# Patient Record
Sex: Female | Born: 1985
Health system: Southern US, Community
[De-identification: ages and names within clinical notes are randomized; demographics above are authoritative.]

## PROBLEM LIST (undated history)

## (undated) DIAGNOSIS — N289 Disorder of kidney and ureter, unspecified: Secondary | ICD-10-CM

## (undated) DIAGNOSIS — O1490 Unspecified pre-eclampsia, unspecified trimester: Secondary | ICD-10-CM

## (undated) DIAGNOSIS — N926 Irregular menstruation, unspecified: Secondary | ICD-10-CM

## (undated) DIAGNOSIS — R002 Palpitations: Secondary | ICD-10-CM

## (undated) DIAGNOSIS — E282 Polycystic ovarian syndrome: Secondary | ICD-10-CM

## (undated) HISTORY — PX: ENDOMETRIAL ABLATION: SHX621

## (undated) HISTORY — DX: Polycystic ovarian syndrome: E28.2

## (undated) HISTORY — PX: SOFT TISSUE CYST EXCISION: SHX2418

## (undated) HISTORY — PX: WISDOM TOOTH EXTRACTION: SHX21

## (undated) HISTORY — PX: TUBAL LIGATION: SHX77

---
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
## 2003-01-22 ENCOUNTER — Emergency Department (HOSPITAL_COMMUNITY): Admission: EM | Admit: 2003-01-22 | Discharge: 2003-01-22 | Payer: Self-pay | Admitting: Emergency Medicine

## 2003-01-27 ENCOUNTER — Emergency Department (HOSPITAL_COMMUNITY): Admission: EM | Admit: 2003-01-27 | Discharge: 2003-01-27 | Payer: Self-pay | Admitting: Emergency Medicine

## 2003-07-31 ENCOUNTER — Encounter (INDEPENDENT_AMBULATORY_CARE_PROVIDER_SITE_OTHER): Payer: Self-pay | Admitting: *Deleted

## 2003-07-31 ENCOUNTER — Inpatient Hospital Stay (HOSPITAL_COMMUNITY): Admission: AD | Admit: 2003-07-31 | Discharge: 2003-08-01 | Payer: Self-pay | Admitting: Obstetrics & Gynecology

## 2009-01-21 ENCOUNTER — Ambulatory Visit: Payer: Self-pay | Admitting: Radiology

## 2009-01-21 ENCOUNTER — Emergency Department (HOSPITAL_BASED_OUTPATIENT_CLINIC_OR_DEPARTMENT_OTHER): Admission: EM | Admit: 2009-01-21 | Discharge: 2009-01-21 | Payer: Self-pay | Admitting: Emergency Medicine

## 2009-05-17 ENCOUNTER — Emergency Department (HOSPITAL_BASED_OUTPATIENT_CLINIC_OR_DEPARTMENT_OTHER): Admission: EM | Admit: 2009-05-17 | Discharge: 2009-05-17 | Payer: Self-pay | Admitting: Emergency Medicine

## 2010-03-19 ENCOUNTER — Emergency Department (HOSPITAL_BASED_OUTPATIENT_CLINIC_OR_DEPARTMENT_OTHER): Admission: EM | Admit: 2010-03-19 | Discharge: 2010-03-19 | Payer: Self-pay | Admitting: Emergency Medicine

## 2010-03-19 ENCOUNTER — Ambulatory Visit: Payer: Self-pay | Admitting: Diagnostic Radiology

## 2010-05-26 LAB — BASIC METABOLIC PANEL
BUN: 9 mg/dL (ref 6–23)
CO2: 28 mEq/L (ref 19–32)
Calcium: 8.9 mg/dL (ref 8.4–10.5)
Chloride: 106 mEq/L (ref 96–112)
Creatinine, Ser: 0.7 mg/dL (ref 0.4–1.2)
GFR calc Af Amer: >60 mL/min (ref >60)
GFR calc non Af Amer: >60 mL/min (ref >60)
Glucose, Bld: 87 mg/dL (ref 70–99)
Potassium: 3.5 mEq/L (ref 3.5–5.1)
Sodium: 138 mEq/L (ref 135–145)

## 2010-05-26 LAB — CBC
HCT: 35.4 % — ABNORMAL LOW (ref 36.0–46.0)
Hemoglobin: 11.3 g/dL — ABNORMAL LOW (ref 12.0–15.0)
MCH: 26.8 pg (ref 26.0–34.0)
MCHC: 31.9 g/dL (ref 30.0–36.0)
MCV: 84.1 fL (ref 78.0–100.0)
Platelets: 199 10*3/uL (ref 150–400)
RBC: 4.21 MIL/uL (ref 3.87–5.11)
RDW: 14.3 % (ref 11.5–15.5)
WBC: 3 10*3/uL — ABNORMAL LOW (ref 4.0–10.5)

## 2010-06-01 ENCOUNTER — Ambulatory Visit (HOSPITAL_COMMUNITY)
Admission: RE | Admit: 2010-06-01 | Discharge: 2010-06-01 | Payer: Self-pay | Source: Home / Self Care | Attending: Obstetrics and Gynecology | Admitting: Obstetrics and Gynecology

## 2010-06-05 LAB — HCG, SERUM, QUALITATIVE: Preg, Serum: NEGATIVE

## 2010-06-05 LAB — TYPE AND SCREEN
ABO/RH(D): O POS
Antibody Screen: NEGATIVE

## 2010-06-05 LAB — ABO/RH: ABO/RH(D): O POS

## 2011-09-10 ENCOUNTER — Emergency Department (HOSPITAL_BASED_OUTPATIENT_CLINIC_OR_DEPARTMENT_OTHER)
Admission: EM | Admit: 2011-09-10 | Discharge: 2011-09-10 | Disposition: A | Discharge status: Home / Self Care | Payer: Self-pay | Attending: Emergency Medicine | Admitting: Emergency Medicine

## 2011-09-10 ENCOUNTER — Encounter (HOSPITAL_BASED_OUTPATIENT_CLINIC_OR_DEPARTMENT_OTHER): Payer: Self-pay

## 2011-09-10 ENCOUNTER — Emergency Department (INDEPENDENT_AMBULATORY_CARE_PROVIDER_SITE_OTHER): Payer: Self-pay

## 2011-09-10 DIAGNOSIS — K5289 Other specified noninfective gastroenteritis and colitis: Secondary | ICD-10-CM | POA: Insufficient documentation

## 2011-09-10 DIAGNOSIS — F17200 Nicotine dependence, unspecified, uncomplicated: Secondary | ICD-10-CM | POA: Insufficient documentation

## 2011-09-10 DIAGNOSIS — R112 Nausea with vomiting, unspecified: Secondary | ICD-10-CM

## 2011-09-10 DIAGNOSIS — R1011 Right upper quadrant pain: Secondary | ICD-10-CM

## 2011-09-10 DIAGNOSIS — K529 Noninfective gastroenteritis and colitis, unspecified: Secondary | ICD-10-CM

## 2011-09-10 DIAGNOSIS — R109 Unspecified abdominal pain: Secondary | ICD-10-CM | POA: Insufficient documentation

## 2011-09-10 HISTORY — DX: Unspecified pre-eclampsia, unspecified trimester: O14.90

## 2011-09-10 HISTORY — DX: Palpitations: R00.2

## 2011-09-10 LAB — DIFFERENTIAL
Basophils Absolute: 0 10*3/uL (ref 0.0–0.1)
Basophils Relative: 0 % (ref 0–1)
Eosinophils Absolute: 0.1 10*3/uL (ref 0.0–0.7)
Eosinophils Relative: 2 % (ref 0–5)
Lymphocytes Relative: 45 % (ref 12–46)
Lymphs Abs: 1.5 10*3/uL (ref 0.7–4.0)
Monocytes Absolute: 0.4 10*3/uL (ref 0.1–1.0)
Monocytes Relative: 11 % (ref 3–12)
Neutro Abs: 1.4 10*3/uL — ABNORMAL LOW (ref 1.7–7.7)
Neutrophils Relative %: 42 % — ABNORMAL LOW (ref 43–77)

## 2011-09-10 LAB — URINALYSIS, ROUTINE W REFLEX MICROSCOPIC
Bilirubin Urine: NEGATIVE
Glucose, UA: NEGATIVE mg/dL
Hgb urine dipstick: NEGATIVE
Ketones, ur: NEGATIVE mg/dL
Leukocytes, UA: NEGATIVE
Nitrite: NEGATIVE
Protein, ur: NEGATIVE mg/dL
Specific Gravity, Urine: 1.037 — ABNORMAL HIGH (ref 1.005–1.030)
Urobilinogen, UA: 1 mg/dL (ref 0.0–1.0)
pH: 6 (ref 5.0–8.0)

## 2011-09-10 LAB — CBC
HCT: 34.3 % — ABNORMAL LOW (ref 36.0–46.0)
Hemoglobin: 11.5 g/dL — ABNORMAL LOW (ref 12.0–15.0)
MCH: 28 pg (ref 26.0–34.0)
MCHC: 33.5 g/dL (ref 30.0–36.0)
MCV: 83.5 fL (ref 78.0–100.0)
Platelets: 131 10*3/uL — ABNORMAL LOW (ref 150–400)
RBC: 4.11 MIL/uL (ref 3.87–5.11)
RDW: 13.4 % (ref 11.5–15.5)
WBC: 3.3 10*3/uL — ABNORMAL LOW (ref 4.0–10.5)

## 2011-09-10 LAB — COMPREHENSIVE METABOLIC PANEL
ALT: 11 U/L (ref 0–35)
AST: 15 U/L (ref 0–37)
Albumin: 3.7 g/dL (ref 3.5–5.2)
Alkaline Phosphatase: 30 U/L — ABNORMAL LOW (ref 39–117)
BUN: 10 mg/dL (ref 6–23)
CO2: 24 mEq/L (ref 19–32)
Calcium: 9 mg/dL (ref 8.4–10.5)
Chloride: 107 mEq/L (ref 96–112)
Creatinine, Ser: 0.6 mg/dL (ref 0.50–1.10)
GFR calc Af Amer: >90 mL/min (ref >90)
GFR calc non Af Amer: >90 mL/min (ref >90)
Glucose, Bld: 86 mg/dL (ref 70–99)
Potassium: 3.5 mEq/L (ref 3.5–5.1)
Sodium: 140 mEq/L (ref 135–145)
Total Bilirubin: 0.4 mg/dL (ref 0.3–1.2)
Total Protein: 6.3 g/dL (ref 6.0–8.3)

## 2011-09-10 LAB — LIPASE, BLOOD: Lipase: 25 U/L (ref 11–59)

## 2011-09-10 LAB — PREGNANCY, URINE: Preg Test, Ur: NEGATIVE

## 2011-09-10 MED ORDER — GI COCKTAIL ~~LOC~~
30.0000 mL | Freq: Once | ORAL | Status: AC
  Administered 2011-09-10: 30 mL via ORAL
  Filled 2011-09-10: qty 30

## 2011-09-10 MED ORDER — SODIUM CHLORIDE 0.9 % IV BOLUS (SEPSIS)
1000.0000 mL | Freq: Once | INTRAVENOUS | Status: AC
  Administered 2011-09-10: 1000 mL via INTRAVENOUS

## 2011-09-10 MED ORDER — FENTANYL CITRATE 0.05 MG/ML IJ SOLN
100.0000 ug | Freq: Once | INTRAMUSCULAR | Status: AC
  Administered 2011-09-10: 100 ug via INTRAVENOUS
  Filled 2011-09-10: qty 2

## 2011-09-10 MED ORDER — HYDROCODONE-ACETAMINOPHEN 5-325 MG PO TABS: 1.0000 | ORAL_TABLET | Freq: Four times a day (QID) | ORAL | Status: AC | PRN

## 2011-09-10 MED ORDER — ONDANSETRON HCL 4 MG/2ML IJ SOLN
4.0000 mg | Freq: Once | INTRAMUSCULAR | Status: AC
  Administered 2011-09-10: 4 mg via INTRAVENOUS
  Filled 2011-09-10: qty 2

## 2011-09-10 MED ORDER — PROMETHAZINE HCL 25 MG PO TABS: 25.0000 mg | ORAL_TABLET | Freq: Four times a day (QID) | ORAL | Status: DC | PRN

## 2011-09-10 MED ORDER — KETOROLAC TROMETHAMINE 30 MG/ML IJ SOLN
30.0000 mg | Freq: Once | INTRAMUSCULAR | Status: AC
  Administered 2011-09-10: 30 mg via INTRAVENOUS
  Filled 2011-09-10: qty 1

## 2011-09-10 MED ORDER — PANTOPRAZOLE SODIUM 40 MG PO TBEC
40.0000 mg | DELAYED_RELEASE_TABLET | Freq: Once | ORAL | Status: AC
  Administered 2011-09-10: 40 mg via ORAL
  Filled 2011-09-10: qty 1

## 2011-09-10 MED ORDER — HYDROCODONE-ACETAMINOPHEN 5-325 MG PO TABS: 1.0000 | ORAL_TABLET | Freq: Four times a day (QID) | ORAL | Status: DC | PRN

## 2011-09-10 NOTE — Discharge Instructions (Signed)
Return here as needed. Slowly increase your fluids. You will want to eat a bland diet over the next 24 hours.

## 2011-09-10 NOTE — ED Provider Notes (Signed)
History     CSN: [id number]  Arrival date & time 09/10/11  1136   First MD Initiated Contact with Patient 09/10/11 1157      Chief Complaint  Patient presents with  . Abdominal Pain    (Consider location/radiation/quality/duration/timing/severity/associated sxs/prior treatment) HPI The patient presents to the ER with a 2 day history of abdominal pain after eating at a chinese buffet. The patient states that she had a BM with no relief of her pain. The patient sates that she has had several episodes of vomiting. The patient states that nothing seems to make the pain better. The pain is increased with palpation of her abdomen. The patient states that the pain is significant at times and seems to reduce at others. The patient has not tried any medications for her discomfort. She states that eating seemed to increase her pain. Patient denies chest pain, SOB, weakness, vomiting, dysuria, vaginal discharge, vaginal bleeding, fever, back pain , or headache.  Past Medical History  Diagnosis Date  . Heart palpitations   . Preeclampsia     Past Surgical History  Procedure Date  . Cesarean section   . Endometrial ablation   . Tubal ligation   . Soft tissue cyst excision   . Wisdom tooth extraction     History reviewed. No pertinent family history.  History  Substance Use Topics  . Smoking status: Current Some Day Smoker  . Smokeless tobacco: Not on file  . Alcohol Use: Yes     socially    OB History    Grav Para Term Preterm Abortions TAB SAB Ect Mult Living                  Review of Systems All others negative as noted in the HPI Allergies  Review of patient's allergies indicates no known allergies.  Home Medications   Current Outpatient Rx  Name Route Sig Dispense Refill  . CYCLOBENZAPRINE HCL 5 MG PO TABS Oral Take 5 mg by mouth at bedtime as needed.    Marland Kitchen METOPROLOL SUCCINATE ER 25 MG PO TB24 Oral Take 25 mg by mouth daily.      BP 102/68  Pulse 74  Temp(Src)  98.4 F (36.9 C) (Oral)  Resp 17  Ht 5\' 1"  (1.549 m)  Wt 140 lb (63.504 kg)  BMI 26.45 kg/m2  SpO2 100%  LMP 08/20/2011  Physical Exam Physical Examination: General appearance - alert, well appearing, and in no distress, oriented to person, place, and time and normal appearing weight Mental status - alert, oriented to person, place, and time, normal mood, behavior, speech, dress, motor activity, and thought processes Ears - bilateral TM's and external ear canals normal Nose - normal and patent, no erythema, discharge or polyps Mouth - mucous membranes moist, pharynx normal without lesions Lymphatics - no palpable lymphadenopathy, no hepatosplenomegaly Chest - clear to auscultation, no wheezes, rales or rhonchi, symmetric air entry, no tachypnea, retractions or cyanosis Heart - normal rate, regular rhythm, normal S1, S2, no murmurs, rubs, clicks or gallops Abdomen - tenderness noted  In the upper R and epigastric region no rebound tenderness noted No guarding noted bowel sounds normal hepatomegaly none Splenomegaly none no bladder distension noted Skin - normal coloration and turgor, no rashes, no suspicious skin lesions noted  ED Course  Procedures (including critical care time)  Labs Reviewed  URINALYSIS, ROUTINE W REFLEX MICROSCOPIC - Abnormal; Notable for the following:    Color, Urine AMBER (*) BIOCHEMICALS MAY BE AFFECTED BY  COLOR   APPearance CLOUDY (*)    Specific Gravity, Urine 1.037 (*)    All other components within normal limits  CBC - Abnormal; Notable for the following:    WBC 3.3 (*)    Hemoglobin 11.5 (*)    HCT 34.3 (*)    Platelets 131 (*)    All other components within normal limits  DIFFERENTIAL - Abnormal; Notable for the following:    Neutrophils Relative 42 (*)    Neutro Abs 1.4 (*)    All other components within normal limits  COMPREHENSIVE METABOLIC PANEL - Abnormal; Notable for the following:    Alkaline Phosphatase 30 (*)    All other  components within normal limits  PREGNANCY, URINE  LIPASE, BLOOD   US Abdomen Complete  09/10/2011  *RADIOLOGY REPORT*  Clinical Data:  Right upper quadrant pain.  Nausea and vomiting.  COMPLETE ABDOMINAL ULTRASOUND  Comparison:  None.  Findings:  Gallbladder:  No gallstones, gallbladder wall thickening, or pericholecystic fluid. Negative sonographic Murphy's sign.  Common bile duct:  Normal.  1 mm in diameter.  Liver:  Normal.  IVC:  Partially obscured by bowel gas.  The visualized portion is normal.  Pancreas:  Normal.  Spleen:  Normal.  5.3 cm in length.  Right Kidney:  Normal.  10.7 cm in length. Tiny portion of the lower pole is obscured by bowel gas.  Left Kidney:  Normal.  10.3 cm in length.  Abdominal aorta:  Partially obscured by bowel gas. 2 cm maximal diameter.  IMPRESSION: Normal exam.  Original Report Authenticated By: Gwynn Burly, M.D.    Patient re-examined and her pain is decreased significantly on palpation  Patient has been rechecked x2 and she is feeling some better. The patient most likely has a gastroenteritis based on her HPI and PE. The patient is able to tolerate oral fluids. Told to return here as needed.  MDM  MDM Reviewed: nursing note and vitals Interpretation: labs and ultrasound   The patient is explained that this could be an evolving process that has yet to declare itself and that she will need to return here as needed for any worsening or changes in her condition. This is most likely a gastroenteritis versus an early biliary issue.         Carlyle Dolly, PA-C 09/13/11 [phone]

## 2011-09-10 NOTE — ED Notes (Signed)
Advised by lab blood hemolyzed-redraw light green and purple top as advised

## 2011-09-10 NOTE — ED Notes (Signed)
Pt is waiting in conference room for dc paperwork and prescriptions.

## 2011-09-10 NOTE — ED Notes (Signed)
Pt states that she had onset of abdominal pain on Friday nigth after eating at a chinese buffet, pt states that pain subsided, but returned on Saturday when she ate again.  Pt states that pain has presented today and caused her to vomit twice today, no diarrhea, afebrile.

## 2011-09-14 NOTE — ED Provider Notes (Signed)
Medical screening examination/treatment/procedure(s) were performed by non-physician practitioner and as supervising physician I was immediately available for consultation/collaboration.   Mickeal Daws W Maddisyn Hegwood, MD 09/14/11 2235 

## 2012-02-10 ENCOUNTER — Encounter (HOSPITAL_BASED_OUTPATIENT_CLINIC_OR_DEPARTMENT_OTHER): Payer: Self-pay | Admitting: *Deleted

## 2012-02-10 ENCOUNTER — Emergency Department (HOSPITAL_BASED_OUTPATIENT_CLINIC_OR_DEPARTMENT_OTHER): Payer: Self-pay

## 2012-02-10 ENCOUNTER — Emergency Department (HOSPITAL_BASED_OUTPATIENT_CLINIC_OR_DEPARTMENT_OTHER)
Admission: EM | Admit: 2012-02-10 | Discharge: 2012-02-10 | Disposition: A | Discharge status: Home / Self Care | Payer: Self-pay | Attending: Emergency Medicine | Admitting: Emergency Medicine

## 2012-02-10 DIAGNOSIS — M1712 Unilateral primary osteoarthritis, left knee: Secondary | ICD-10-CM

## 2012-02-10 DIAGNOSIS — F17200 Nicotine dependence, unspecified, uncomplicated: Secondary | ICD-10-CM | POA: Insufficient documentation

## 2012-02-10 DIAGNOSIS — M1710 Unilateral primary osteoarthritis, unspecified knee: Secondary | ICD-10-CM | POA: Insufficient documentation

## 2012-02-10 LAB — PREGNANCY, URINE: Preg Test, Ur: NEGATIVE

## 2012-02-10 MED ORDER — IBUPROFEN 400 MG PO TABS
400.0000 mg | ORAL_TABLET | Freq: Once | ORAL | Status: AC
Start: 2012-02-10 — End: 2012-02-10
  Administered 2012-02-10: 400 mg via ORAL
  Filled 2012-02-10: qty 1

## 2012-02-10 MED ORDER — IBUPROFEN 400 MG PO TABS: ORAL_TABLET | ORAL | Status: DC

## 2012-02-10 NOTE — ED Provider Notes (Signed)
History   This chart was scribed for Carleene Cooper III, MD by Sofie Rower. The patient was seen in room MH06/MH06 and the patient's care was started at 8:31AM.   CSN: [id number]  Arrival date & time 02/10/12  1842   First MD Initiated Contact with Patient 02/10/12 2031      Chief Complaint  Patient presents with  . Knee Pain    (Consider location/radiation/quality/duration/timing/severity/associated sxs/prior treatment) Patient is a 26 y.o. female presenting with knee pain. The history is provided by the patient. No language interpreter was used.  Knee Pain This is a recurrent problem. The current episode started more than 2 days ago. The problem occurs constantly. The problem has been gradually worsening. Pertinent negatives include no chest pain, no abdominal pain, no headaches and no shortness of breath. The symptoms are aggravated by walking. The symptoms are relieved by rest. She has tried nothing for the symptoms. The treatment provided no relief.    Sabrina Owens is a 26 y.o. female , with a hx of left knee pain, who presents to the Emergency Department complaining of sudden, progressively worsening, knee pain located at the left knee, radiating upwards towards the left thigh, onset three days ago with associated symptoms of neck stiffness. The pt reports the knee pain located at the left knee is a throbbing pain. Modifying factors include walking which intensifies the knee pain and resting which provides moderate relief of the knee pain. The pt has a hx of c-section (X 3), blood transfusion, cyst removal, and tonsillectomy. The pt has an allergy to NSAID's.   The pt denies any hx of gout or rheumatoid arthritis, right knee pain, and fever.    The pt is a current smoker and drinks alcohol on occasion.      Past Medical History  Diagnosis Date  . Heart palpitations   . Preeclampsia     Past Surgical History  Procedure Date  . Cesarean section   . Endometrial ablation   .  Tubal ligation   . Soft tissue cyst excision   . Wisdom tooth extraction     History reviewed. No pertinent family history.  History  Substance Use Topics  . Smoking status: Current Some Day Smoker  . Smokeless tobacco: Not on file  . Alcohol Use: Yes     socially    OB History    Grav Para Term Preterm Abortions TAB SAB Ect Mult Living                  Review of Systems  Respiratory: Negative for shortness of breath.   Cardiovascular: Negative for chest pain.  Gastrointestinal: Negative for abdominal pain.  Neurological: Negative for headaches.  All other systems reviewed and are negative.    Allergies  Review of patient's allergies indicates no known allergies.  Home Medications   Current Outpatient Rx  Name Route Sig Dispense Refill  . CYCLOBENZAPRINE HCL 5 MG PO TABS Oral Take 5 mg by mouth at bedtime as needed.    Marland Kitchen METOPROLOL SUCCINATE ER 25 MG PO TB24 Oral Take 25 mg by mouth daily.    Marland Kitchen PROMETHAZINE HCL 25 MG PO TABS Oral Take 1 tablet (25 mg total) by mouth every 6 (six) hours as needed for nausea. 10 tablet 0    BP 113/80  Pulse 84  Temp 98.2 F (36.8 C) (Oral)  Resp 18  Ht 5' (1.524 m)  Wt 130 lb (58.968 kg)  BMI 25.39 kg/m2  SpO2  100%  LMP 02/03/2012  Physical Exam  Nursing note and vitals reviewed. Constitutional: She is oriented to person, place, and time. She appears well-developed and well-nourished.  HENT:  Head: Atraumatic.  Nose: Nose normal.  Eyes: Conjunctivae normal and EOM are normal.  Neck: Normal range of motion.  Pulmonary/Chest: Effort normal.  Musculoskeletal: She exhibits no edema and no tenderness.       Medial collateral ligament of left knee: no effusion, intact motor sensation of left leg.  Neurological: She is alert and oriented to person, place, and time.  Skin: Skin is warm and dry.  Psychiatric: She has a normal mood and affect. Her behavior is normal.    ED Course  Procedures (including critical care  time)  DIAGNOSTIC STUDIES: Oxygen Saturation is 100% on room air, normal by my interpretation.    COORDINATION OF CARE:    8:57PM- X-ray of left knee discussed with patient. Pt agrees with treatment.   10:08PM- X-ray results and treatment plan discussed with patient. Pt agrees with treatment.   Results for orders placed during the hospital encounter of 02/10/12  PREGNANCY, URINE      Component Value Range   Preg Test, Ur NEGATIVE  NEGATIVE   Dg Knee 1-2 Views Left  02/10/2012  *RADIOLOGY REPORT*  Clinical Data: 26 year old female with knee pain.  LEFT KNEE - 1-2 VIEW  Comparison: None.  Findings: No joint effusion is evident. Bone mineralization is within normal limits.  Preserved joint spaces. Patella intact.  No fracture.  IMPRESSION: No acute osseous abnormality identified about the left knee.   Original Report Authenticated By: Harley Hallmark, M.D.       1. Arthritis of left knee    Advised a knee sleeve when up, ibuprofen 400 mg qid with food or milk.  I personally performed the services described in this documentation, which was scribed in my presence. The recorded information has been reviewed and considered.  Osvaldo Human, MD     Carleene Cooper III, MD 02/10/12 2214

## 2012-02-10 NOTE — ED Notes (Signed)
MD at bedside. 

## 2012-02-10 NOTE — ED Notes (Signed)
Pt states she has had sev prev episodes of left knee pain. No known injury.

## 2012-05-19 ENCOUNTER — Emergency Department (HOSPITAL_BASED_OUTPATIENT_CLINIC_OR_DEPARTMENT_OTHER)
Admission: EM | Admit: 2012-05-19 | Discharge: 2012-05-19 | Disposition: A | Discharge status: Home / Self Care | Payer: Self-pay | Attending: Emergency Medicine | Admitting: Emergency Medicine

## 2012-05-19 ENCOUNTER — Encounter (HOSPITAL_BASED_OUTPATIENT_CLINIC_OR_DEPARTMENT_OTHER): Payer: Self-pay | Admitting: *Deleted

## 2012-05-19 DIAGNOSIS — Z8679 Personal history of other diseases of the circulatory system: Secondary | ICD-10-CM | POA: Insufficient documentation

## 2012-05-19 DIAGNOSIS — Z791 Long term (current) use of non-steroidal anti-inflammatories (NSAID): Secondary | ICD-10-CM | POA: Insufficient documentation

## 2012-05-19 DIAGNOSIS — Z8742 Personal history of other diseases of the female genital tract: Secondary | ICD-10-CM | POA: Insufficient documentation

## 2012-05-19 DIAGNOSIS — R6883 Chills (without fever): Secondary | ICD-10-CM | POA: Insufficient documentation

## 2012-05-19 DIAGNOSIS — F17200 Nicotine dependence, unspecified, uncomplicated: Secondary | ICD-10-CM | POA: Insufficient documentation

## 2012-05-19 DIAGNOSIS — Z79899 Other long term (current) drug therapy: Secondary | ICD-10-CM | POA: Insufficient documentation

## 2012-05-19 DIAGNOSIS — J020 Streptococcal pharyngitis: Secondary | ICD-10-CM | POA: Insufficient documentation

## 2012-05-19 DIAGNOSIS — Z87898 Personal history of other specified conditions: Secondary | ICD-10-CM | POA: Insufficient documentation

## 2012-05-19 MED ORDER — PENICILLIN G BENZATHINE 1200000 UNIT/2ML IM SUSP
1.2000 10*6.[IU] | Freq: Once | INTRAMUSCULAR | Status: AC
  Administered 2012-05-19: 1.2 10*6.[IU] via INTRAMUSCULAR
  Filled 2012-05-19: qty 2

## 2012-05-19 NOTE — ED Provider Notes (Signed)
History     CSN: [id number]  Arrival date & time 05/19/12  4098   First MD Initiated Contact with Patient 05/19/12 2112      Chief Complaint  Patient presents with  . Sore Throat  . Fever  . Chills    (Consider location/radiation/quality/duration/timing/severity/associated sxs/prior treatment) Patient is a 26 y.o. female presenting with pharyngitis. The history is provided by the patient. No language interpreter was used.  Sore Throat This is a new problem. The current episode started yesterday. The problem occurs constantly. The problem has been gradually worsening. Associated symptoms include chills and a sore throat. The symptoms are aggravated by drinking. She has tried acetaminophen for the symptoms. The treatment provided no relief.   Pt has a history of multiple strep infections Past Medical History  Diagnosis Date  . Heart palpitations   . Preeclampsia     Past Surgical History  Procedure Date  . Cesarean section   . Endometrial ablation   . Tubal ligation   . Soft tissue cyst excision   . Wisdom tooth extraction     History reviewed. No pertinent family history.  History  Substance Use Topics  . Smoking status: Current Some Day Smoker  . Smokeless tobacco: Not on file  . Alcohol Use: Yes     Comment: socially    OB History    Grav Para Term Preterm Abortions TAB SAB Ect Mult Living                  Review of Systems  Constitutional: Positive for chills.  HENT: Positive for sore throat.   All other systems reviewed and are negative.    Allergies  Review of patient's allergies indicates no known allergies.  Home Medications   Current Outpatient Rx  Name  Route  Sig  Dispense  Refill  . CYCLOBENZAPRINE HCL 5 MG PO TABS   Oral   Take 5 mg by mouth at bedtime as needed.         . IBUPROFEN 400 MG PO TABS      Take one tablet four times per day, with food or milk.   20 tablet   0   . METOPROLOL SUCCINATE ER 25 MG PO TB24   Oral  Take 25 mg by mouth daily.         Marland Kitchen PROMETHAZINE HCL 25 MG PO TABS   Oral   Take 1 tablet (25 mg total) by mouth every 6 (six) hours as needed for nausea.   10 tablet   0     BP 118/69  Pulse 81  Temp 99.1 F (37.3 C) (Oral)  Resp 14  Ht 5\' 1"  (1.549 m)  Wt 130 lb (58.968 kg)  BMI 24.56 kg/m2  SpO2 100%  LMP 05/14/2012  Physical Exam  Nursing note and vitals reviewed. Constitutional: She is oriented to person, place, and time. She appears well-developed and well-nourished.  HENT:  Head: Normocephalic and atraumatic.  Right Ear: External ear normal.  Left Ear: External ear normal.  Nose: Nose normal.  Eyes: Conjunctivae normal and EOM are normal. Pupils are equal, round, and reactive to light.  Neck: Normal range of motion. Neck supple.  Cardiovascular: Normal rate and normal heart sounds.   Pulmonary/Chest: Effort normal and breath sounds normal.  Abdominal: Soft. Bowel sounds are normal.  Musculoskeletal: Normal range of motion.  Neurological: She is alert and oriented to person, place, and time. She has normal reflexes.  Skin: Skin is warm.  Psychiatric: She has a normal mood and affect.    ED Course  Procedures (including critical care time)  Labs Reviewed  RAPID STREP SCREEN - Abnormal; Notable for the following:    Streptococcus, Group A Screen (Direct) POSITIVE (*)     All other components within normal limits   No results found.   No diagnosis found.    MDM  Strep positive,   Pt given bicillian injection         Lonia Skinner [place], [place] 05/19/12 2211

## 2012-05-19 NOTE — ED Notes (Signed)
Sore throat fever chills onset yesterday

## 2012-05-20 NOTE — ED Provider Notes (Signed)
Medical screening examination/treatment/procedure(s) were performed by non-physician practitioner and as supervising physician I was immediately available for consultation/collaboration.   Gavin Pound. Ludene Stokke, MD 05/20/12 1100

## 2012-07-10 ENCOUNTER — Emergency Department (HOSPITAL_BASED_OUTPATIENT_CLINIC_OR_DEPARTMENT_OTHER)
Admission: EM | Admit: 2012-07-10 | Discharge: 2012-07-10 | Disposition: A | Discharge status: Home / Self Care | Payer: Self-pay | Attending: Emergency Medicine | Admitting: Emergency Medicine

## 2012-07-10 ENCOUNTER — Encounter (HOSPITAL_BASED_OUTPATIENT_CLINIC_OR_DEPARTMENT_OTHER): Payer: Self-pay

## 2012-07-10 DIAGNOSIS — Z8679 Personal history of other diseases of the circulatory system: Secondary | ICD-10-CM | POA: Insufficient documentation

## 2012-07-10 DIAGNOSIS — Z9851 Tubal ligation status: Secondary | ICD-10-CM | POA: Insufficient documentation

## 2012-07-10 DIAGNOSIS — Z3202 Encounter for pregnancy test, result negative: Secondary | ICD-10-CM | POA: Insufficient documentation

## 2012-07-10 DIAGNOSIS — Z79899 Other long term (current) drug therapy: Secondary | ICD-10-CM | POA: Insufficient documentation

## 2012-07-10 DIAGNOSIS — F1210 Cannabis abuse, uncomplicated: Secondary | ICD-10-CM | POA: Insufficient documentation

## 2012-07-10 DIAGNOSIS — N390 Urinary tract infection, site not specified: Secondary | ICD-10-CM | POA: Insufficient documentation

## 2012-07-10 DIAGNOSIS — R109 Unspecified abdominal pain: Secondary | ICD-10-CM | POA: Insufficient documentation

## 2012-07-10 LAB — URINALYSIS, ROUTINE W REFLEX MICROSCOPIC
Glucose, UA: NEGATIVE mg/dL
Hgb urine dipstick: NEGATIVE
Protein, ur: 30 mg/dL — AB
pH: 6 (ref 5.0–8.0)

## 2012-07-10 LAB — PREGNANCY, URINE: Preg Test, Ur: NEGATIVE

## 2012-07-10 LAB — URINE MICROSCOPIC-ADD ON

## 2012-07-10 MED ORDER — HYDROCODONE-ACETAMINOPHEN 5-325 MG PO TABS: 2.0000 | ORAL_TABLET | ORAL | Status: DC | PRN

## 2012-07-10 MED ORDER — CIPROFLOXACIN HCL 500 MG PO TABS: 500.0000 mg | ORAL_TABLET | Freq: Two times a day (BID) | ORAL | Status: DC

## 2012-07-10 NOTE — ED Notes (Addendum)
Pt requested work note showing she was here for treatment-done-pt was advised of costs of meds- states that she is able to afford the costs of both meds

## 2012-07-10 NOTE — ED Provider Notes (Signed)
History     CSN: [id number]  Arrival date & time 07/10/12  1225   First MD Initiated Contact with Patient 07/10/12 1231      Chief Complaint  Patient presents with  . Back Pain    (Consider location/radiation/quality/duration/timing/severity/associated sxs/prior treatment) Patient is a 27 y.o. female presenting with back pain. The history is provided by the patient. No language interpreter was used.  Back Pain Location:  Generalized Quality:  Aching Pain severity:  Moderate Pain is:  Same all the time Duration:  2 days Timing:  Constant Progression:  Worsening Chronicity:  New Relieved by:  Nothing Worsened by:  Nothing tried Ineffective treatments:  None tried  Pt point to left flank area as area of pain.  No injury Past Medical History  Diagnosis Date  . Heart palpitations   . Preeclampsia     Past Surgical History  Procedure Laterality Date  . Cesarean section    . Endometrial ablation    . Tubal ligation    . Soft tissue cyst excision    . Wisdom tooth extraction      No family history on file.  History  Substance Use Topics  . Smoking status: Never Smoker   . Smokeless tobacco: Not on file  . Alcohol Use: Yes     Comment: socially    OB History   Grav Para Term Preterm Abortions TAB SAB Ect Mult Living                  Review of Systems  Musculoskeletal: Positive for back pain.  All other systems reviewed and are negative.    Allergies  Ancef  Home Medications   Current Outpatient Rx  Name  Route  Sig  Dispense  Refill  . cyclobenzaprine (FLEXERIL) 5 MG tablet   Oral   Take 5 mg by mouth at bedtime as needed.         Marland Kitchen ibuprofen (ADVIL,MOTRIN) 400 MG tablet      Take one tablet four times per day, with food or milk.   20 tablet   0   . metoprolol succinate (TOPROL-XL) 25 MG 24 hr tablet   Oral   Take 25 mg by mouth daily.         Marland Kitchen EXPIRED: promethazine (PHENERGAN) 25 MG tablet   Oral   Take 1 tablet (25 mg total) by  mouth every 6 (six) hours as needed for nausea.   10 tablet   0     BP 132/72  Pulse 94  Temp(Src) 98.1 F (36.7 C) (Oral)  Resp 16  Ht 5' (1.524 m)  Wt 132 lb (59.875 kg)  BMI 25.78 kg/m2  SpO2 100%  LMP 07/03/2012  Physical Exam  Vitals reviewed. Constitutional: She is oriented to person, place, and time. She appears well-developed and well-nourished.  HENT:  Head: Normocephalic.  Eyes: EOM are normal.  Neck: Normal range of motion.  Cardiovascular: Normal rate and regular rhythm.   Pulmonary/Chest: Effort normal.  Abdominal: Soft. She exhibits no distension.  Musculoskeletal: Normal range of motion.  Tender left flank  Neurological: She is alert and oriented to person, place, and time.  Psychiatric: She has a normal mood and affect.    ED Course  Procedures (including critical care time)  Labs Reviewed  URINALYSIS, ROUTINE W REFLEX MICROSCOPIC - Abnormal; Notable for the following:    Color, Urine AMBER (*)    APPearance CLOUDY (*)    Ketones, ur 15 (*)  Protein, ur 30 (*)    Nitrite POSITIVE (*)    Leukocytes, UA MODERATE (*)    All other components within normal limits  URINE MICROSCOPIC-ADD ON - Abnormal; Notable for the following:    Squamous Epithelial / LPF MANY (*)    Bacteria, UA MANY (*)    All other components within normal limits  URINE CULTURE  PREGNANCY, URINE   No results found.   No diagnosis found.    MDM   Results for orders placed during the hospital encounter of 07/10/12  URINALYSIS, ROUTINE W REFLEX MICROSCOPIC      Result Value Range   Color, Urine AMBER (*) YELLOW   APPearance CLOUDY (*) CLEAR   Specific Gravity, Urine 1.028  1.005 - 1.030   pH 6.0  5.0 - 8.0   Glucose, UA NEGATIVE  NEGATIVE mg/dL   Hgb urine dipstick NEGATIVE  NEGATIVE   Bilirubin Urine NEGATIVE  NEGATIVE   Ketones, ur 15 (*) NEGATIVE mg/dL   Protein, ur 30 (*) NEGATIVE mg/dL   Urobilinogen, UA 0.2  0.0 - 1.0 mg/dL   Nitrite POSITIVE (*) NEGATIVE    Leukocytes, UA MODERATE (*) NEGATIVE  PREGNANCY, URINE      Result Value Range   Preg Test, Ur NEGATIVE  NEGATIVE  URINE MICROSCOPIC-ADD ON      Result Value Range   Squamous Epithelial / LPF MANY (*) RARE   WBC, UA 21-50  <3 WBC/hpf   RBC / HPF 0-2  <3 RBC/hpf   Bacteria, UA MANY (*) RARE   Urine-Other MUCOUS PRESENT     No results found.     cipro 500mg  one bid,  Hydrocodone for pain.  Return if any problems.    Lonia Skinner [place], [place] 07/10/12 1316

## 2012-07-10 NOTE — ED Notes (Signed)
C/o left lower back pain x 2 dasy "felt a catch when folding clothes"-pt points to left flank-states she was dx with UTI approx 2 weeks ago at [facility] but could not afford abx

## 2012-07-11 NOTE — ED Provider Notes (Signed)
Medical screening examination/treatment/procedure(s) were performed by non-physician practitioner and as supervising physician I was immediately available for consultation/collaboration.    Vida Roller, MD 07/11/12 [phone]

## 2012-07-13 LAB — URINE CULTURE: Colony Count: >=100000

## 2012-07-14 ENCOUNTER — Telehealth (HOSPITAL_COMMUNITY): Payer: Self-pay | Admitting: Emergency Medicine

## 2012-07-14 NOTE — ED Notes (Signed)
Positive urnc- treated with cirpo - resistant- chart sent to EDP office for review

## 2012-07-16 ENCOUNTER — Telehealth (HOSPITAL_COMMUNITY): Payer: Self-pay | Admitting: Emergency Medicine

## 2012-07-16 NOTE — ED Notes (Signed)
Chart reviewed by Dr Azalia Bilis "Macrobid 100 mg po BID x 7 days"  Pt informed of dx and need for addl tx.  Rx called to [facility] pharm. RPH @ [phone].

## 2012-09-18 ENCOUNTER — Emergency Department (HOSPITAL_BASED_OUTPATIENT_CLINIC_OR_DEPARTMENT_OTHER)
Admission: EM | Admit: 2012-09-18 | Discharge: 2012-09-18 | Disposition: A | Discharge status: Home / Self Care | Payer: Self-pay | Attending: Emergency Medicine | Admitting: Emergency Medicine

## 2012-09-18 ENCOUNTER — Encounter (HOSPITAL_BASED_OUTPATIENT_CLINIC_OR_DEPARTMENT_OTHER): Payer: Self-pay | Admitting: *Deleted

## 2012-09-18 ENCOUNTER — Emergency Department (HOSPITAL_BASED_OUTPATIENT_CLINIC_OR_DEPARTMENT_OTHER): Payer: Self-pay

## 2012-09-18 DIAGNOSIS — N926 Irregular menstruation, unspecified: Secondary | ICD-10-CM | POA: Insufficient documentation

## 2012-09-18 DIAGNOSIS — Z3202 Encounter for pregnancy test, result negative: Secondary | ICD-10-CM | POA: Insufficient documentation

## 2012-09-18 DIAGNOSIS — Z9851 Tubal ligation status: Secondary | ICD-10-CM | POA: Insufficient documentation

## 2012-09-18 DIAGNOSIS — R110 Nausea: Secondary | ICD-10-CM | POA: Insufficient documentation

## 2012-09-18 DIAGNOSIS — D709 Neutropenia, unspecified: Secondary | ICD-10-CM | POA: Insufficient documentation

## 2012-09-18 DIAGNOSIS — A5901 Trichomonal vulvovaginitis: Secondary | ICD-10-CM | POA: Insufficient documentation

## 2012-09-18 DIAGNOSIS — R109 Unspecified abdominal pain: Secondary | ICD-10-CM | POA: Insufficient documentation

## 2012-09-18 DIAGNOSIS — A599 Trichomoniasis, unspecified: Secondary | ICD-10-CM

## 2012-09-18 DIAGNOSIS — Z8679 Personal history of other diseases of the circulatory system: Secondary | ICD-10-CM | POA: Insufficient documentation

## 2012-09-18 DIAGNOSIS — Z9889 Other specified postprocedural states: Secondary | ICD-10-CM | POA: Insufficient documentation

## 2012-09-18 DIAGNOSIS — Z79899 Other long term (current) drug therapy: Secondary | ICD-10-CM | POA: Insufficient documentation

## 2012-09-18 DIAGNOSIS — Z8742 Personal history of other diseases of the female genital tract: Secondary | ICD-10-CM | POA: Insufficient documentation

## 2012-09-18 HISTORY — DX: Irregular menstruation, unspecified: N92.6

## 2012-09-18 LAB — COMPREHENSIVE METABOLIC PANEL
ALT: 17 U/L (ref 0–35)
Albumin: 3.9 g/dL (ref 3.5–5.2)
Alkaline Phosphatase: 31 U/L — ABNORMAL LOW (ref 39–117)
BUN: 8 mg/dL (ref 6–23)
Chloride: 106 mEq/L (ref 96–112)
GFR calc Af Amer: >90 mL/min (ref >90)
Glucose, Bld: 99 mg/dL (ref 70–99)
Potassium: 3.9 mEq/L (ref 3.5–5.1)
Total Bilirubin: 0.3 mg/dL (ref 0.3–1.2)

## 2012-09-18 LAB — CBC WITH DIFFERENTIAL/PLATELET
Basophils Absolute: 0 10*3/uL (ref 0.0–0.1)
Eosinophils Absolute: 0.1 10*3/uL (ref 0.0–0.7)
Lymphs Abs: 1.4 10*3/uL (ref 0.7–4.0)
MCHC: 33.4 g/dL (ref 30.0–36.0)
MCV: 85.9 fL (ref 78.0–100.0)
Monocytes Relative: 10 % (ref 3–12)
Platelets: 170 10*3/uL (ref 150–400)
RDW: 15.5 % (ref 11.5–15.5)
WBC: 2.7 10*3/uL — ABNORMAL LOW (ref 4.0–10.5)

## 2012-09-18 LAB — URINALYSIS, ROUTINE W REFLEX MICROSCOPIC
Bilirubin Urine: NEGATIVE
Glucose, UA: NEGATIVE mg/dL
Ketones, ur: NEGATIVE mg/dL
Specific Gravity, Urine: 1.021 (ref 1.005–1.030)

## 2012-09-18 LAB — URINE MICROSCOPIC-ADD ON

## 2012-09-18 LAB — WET PREP, GENITAL

## 2012-09-18 MED ORDER — METRONIDAZOLE 500 MG PO TABS: 500.0000 mg | ORAL_TABLET | Freq: Two times a day (BID) | ORAL | Status: DC

## 2012-09-18 NOTE — ED Notes (Signed)
Patient states she has had mid achy abdominal pain for the last one month, radiating in to mid back and is associated with nausea. Symptoms have worsened over the last 2 weeks.

## 2012-09-18 NOTE — ED Provider Notes (Signed)
History     CSN: [id number]  Arrival date & time 09/18/12  0865   First MD Initiated Contact with Patient 09/18/12 [phone]      Chief Complaint  Patient presents with  . Abdominal Pain    (Consider location/radiation/quality/duration/timing/severity/associated sxs/prior treatment) HPI Comments: Patient presents with a left-sided abdominal pain for the last 3 weeks. She states that it's been intermittent but has been worsening over the last few days. She describes as a crampy pain in her left midabdomen at times it radiates through her abdomen. She has had a history of irregular periods which is continued. She typically gets left-sided crampy pain prior to her period but she states this is worse than the pain that she normally has. She's had some nausea but no vomiting. She denies any diarrhea. She does have a long-standing history of constipation. She denies he fevers or chills. She has some vaginal bleeding but denies any vaginal discharge.   Past Medical History  Diagnosis Date  . Heart palpitations   . Preeclampsia   . Irregular menstrual bleeding     Past Surgical History  Procedure Laterality Date  . Cesarean section    . Endometrial ablation    . Tubal ligation    . Soft tissue cyst excision    . Wisdom tooth extraction      No family history on file.  History  Substance Use Topics  . Smoking status: Never Smoker   . Smokeless tobacco: Not on file  . Alcohol Use: Yes     Comment: socially    OB History   Grav Para Term Preterm Abortions TAB SAB Ect Mult Living                  Review of Systems  Constitutional: Negative for fever, chills, diaphoresis and fatigue.  HENT: Negative for congestion, rhinorrhea and sneezing.   Eyes: Negative.   Respiratory: Negative for cough, chest tightness and shortness of breath.   Cardiovascular: Negative for chest pain and leg swelling.  Gastrointestinal: Positive for nausea and abdominal pain. Negative for vomiting, diarrhea  and blood in stool.  Genitourinary: Positive for vaginal bleeding. Negative for frequency, hematuria, flank pain, vaginal discharge and difficulty urinating.  Musculoskeletal: Negative for back pain and arthralgias.  Skin: Negative for rash.  Neurological: Negative for dizziness, speech difficulty, weakness, numbness and headaches.    Allergies  Ancef  Home Medications   Current Outpatient Rx  Name  Route  Sig  Dispense  Refill  . ciprofloxacin (CIPRO) 500 MG tablet   Oral   Take 1 tablet (500 mg total) by mouth every 12 (twelve) hours.   10 tablet   0   . cyclobenzaprine (FLEXERIL) 5 MG tablet   Oral   Take 5 mg by mouth at bedtime as needed.         Marland Kitchen HYDROcodone-acetaminophen (NORCO/VICODIN) 5-325 MG per tablet   Oral   Take 2 tablets by mouth every 4 (four) hours as needed for pain.   10 tablet   0   . ibuprofen (ADVIL,MOTRIN) 400 MG tablet      Take one tablet four times per day, with food or milk.   20 tablet   0   . metoprolol succinate (TOPROL-XL) 25 MG 24 hr tablet   Oral   Take 25 mg by mouth daily.         . metroNIDAZOLE (FLAGYL) 500 MG tablet   Oral   Take 1 tablet (500 mg total)  by mouth 2 (two) times daily. One po bid x 7 days   14 tablet   0   . EXPIRED: promethazine (PHENERGAN) 25 MG tablet   Oral   Take 1 tablet (25 mg total) by mouth every 6 (six) hours as needed for nausea.   10 tablet   0     BP 108/64  Pulse 60  Temp(Src) 97.9 F (36.6 C) (Oral)  Resp 16  Ht 5\' 1"  (1.549 m)  Wt 137 lb (62.143 kg)  BMI 25.9 kg/m2  SpO2 100%  LMP 09/14/2012  Physical Exam  Constitutional: She is oriented to person, place, and time. She appears well-developed and well-nourished.  HENT:  Head: Normocephalic and atraumatic.  Eyes: Pupils are equal, round, and reactive to light.  Neck: Normal range of motion. Neck supple.  Cardiovascular: Normal rate, regular rhythm and normal heart sounds.   Pulmonary/Chest: Effort normal and breath sounds  normal. No respiratory distress. She has no wheezes. She has no rales. She exhibits no tenderness.  Abdominal: Soft. Bowel sounds are normal. There is no tenderness (moderate tenderness to left mid and lower abdomen). There is no rebound and no guarding.  Musculoskeletal: Normal range of motion. She exhibits no edema.  Lymphadenopathy:    She has no cervical adenopathy.  Neurological: She is alert and oriented to person, place, and time.  Skin: Skin is warm and dry. No rash noted.  Psychiatric: She has a normal mood and affect.    ED Course  Procedures (including critical care time)  Results for orders placed during the hospital encounter of 09/18/12  WET PREP, GENITAL      Result Value Range   Yeast Wet Prep HPF POC NONE SEEN  NONE SEEN   Trich, Wet Prep MANY (*) NONE SEEN   Clue Cells Wet Prep HPF POC MANY (*) NONE SEEN   WBC, Wet Prep HPF POC MODERATE (*) NONE SEEN  URINALYSIS, ROUTINE W REFLEX MICROSCOPIC      Result Value Range   Color, Urine YELLOW  YELLOW   APPearance CLEAR  CLEAR   Specific Gravity, Urine 1.021  1.005 - 1.030   pH 8.0  5.0 - 8.0   Glucose, UA NEGATIVE  NEGATIVE mg/dL   Hgb urine dipstick MODERATE (*) NEGATIVE   Bilirubin Urine NEGATIVE  NEGATIVE   Ketones, ur NEGATIVE  NEGATIVE mg/dL   Protein, ur NEGATIVE  NEGATIVE mg/dL   Urobilinogen, UA 1.0  0.0 - 1.0 mg/dL   Nitrite NEGATIVE  NEGATIVE   Leukocytes, UA SMALL (*) NEGATIVE  PREGNANCY, URINE      Result Value Range   Preg Test, Ur NEGATIVE  NEGATIVE  COMPREHENSIVE METABOLIC PANEL      Result Value Range   Sodium 142  135 - 145 mEq/L   Potassium 3.9  3.5 - 5.1 mEq/L   Chloride 106  96 - 112 mEq/L   CO2 27  19 - 32 mEq/L   Glucose, Bld 99  70 - 99 mg/dL   BUN 8  6 - 23 mg/dL   Creatinine, Ser 1.19  0.50 - 1.10 mg/dL   Calcium 9.3  8.4 - 14.7 mg/dL   Total Protein 6.8  6.0 - 8.3 g/dL   Albumin 3.9  3.5 - 5.2 g/dL   AST 19  0 - 37 U/L   ALT 17  0 - 35 U/L   Alkaline Phosphatase 31 (*) 39 - 117  U/L   Total Bilirubin 0.3  0.3 - 1.2 mg/dL  GFR calc non Af Amer >90  >90 mL/min   GFR calc Af Amer >90  >90 mL/min  CBC WITH DIFFERENTIAL      Result Value Range   WBC 2.7 (*) 4.0 - 10.5 K/uL   RBC 4.18  3.87 - 5.11 MIL/uL   Hemoglobin 12.0  12.0 - 15.0 g/dL   HCT 16.1 (*) 09.6 - 04.5 %   MCV 85.9  78.0 - 100.0 fL   MCH 28.7  26.0 - 34.0 pg   MCHC 33.4  30.0 - 36.0 g/dL   RDW 40.9  81.1 - 91.4 %   Platelets 170  150 - 400 K/uL   Neutrophils Relative 35 (*) 43 - 77 %   Lymphocytes Relative 51 (*) 12 - 46 %   Monocytes Relative 10  3 - 12 %   Eosinophils Relative 3  0 - 5 %   Basophils Relative 1  0 - 1 %   Neutro Abs 0.9 (*) 1.7 - 7.7 K/uL   Lymphs Abs 1.4  0.7 - 4.0 K/uL   Monocytes Absolute 0.3  0.1 - 1.0 K/uL   Eosinophils Absolute 0.1  0.0 - 0.7 K/uL   Basophils Absolute 0.0  0.0 - 0.1 K/uL   Smear Review MORPHOLOGY UNREMARKABLE    URINE MICROSCOPIC-ADD ON      Result Value Range   Squamous Epithelial / LPF FEW (*) RARE   WBC, UA 3-6  <3 WBC/hpf   RBC / HPF 3-6  <3 RBC/hpf   Bacteria, UA MANY (*) RARE   Urine-Other TRICHOMONAS PRESENT    PATHOLOGIST SMEAR REVIEW      Result Value Range   Path Review NEUTROPENIA    RAPID HIV SCREEN ([facility])      Result Value Range   SUDS Rapid HIV Screen NON REACTIVE  NON REACTIVE   US Transvaginal Non-ob  09/18/2012  *RADIOLOGY REPORT*  Clinical Data:  Left lower quadrant pain. Tested positive for STD today. LMP 09/14/2012.  TRANSABDOMINAL AND TRANSVAGINAL ULTRASOUND OF PELVIS DOPPLER ULTRASOUND OF OVARIES  Technique:  Both transabdominal and transvaginal ultrasound examinations of the pelvis were performed. Transabdominal technique was performed for global imaging of the pelvis including uterus, ovaries, adnexal regions, and pelvic cul-de-sac.  It was necessary to proceed with endovaginal exam following the transabdominal exam to visualize the myometrium, endometrium and adnexa.  Color and duplex Doppler ultrasound was utilized to  evaluate blood flow to the ovaries.  Comparison:  None.  Findings:  Uterus:  Is anteverted and anteflexed and demonstrates a sagittal length of 9.8 cm, depth of 5.0 cm and width of 6.3 cm.  A homogeneous myometrium is seen  Endometrium:  Appears thin with a width of 5.6 mm.  No areas of thickening or heterogeneity are seen  Right ovary: Measures 3.5 x 1.9 x 2.8 cm and has a normal appearance with a corpus luteum.  Flow is identified within the ovary with color Doppler evaluation.  Left ovary:   Measures 2.8 x 2.5 x 2.7 cm and has a normal appearance.  Flow is seen within the ovarian stroma with color Doppler assessment  Pulsed Doppler evaluation demonstrates normal low-resistance arterial and venous waveforms in both ovaries.  Other: No pelvic fluid or separate adnexal masses are seen  IMPRESSION: Unremarkable pelvic ultrasound with no sonographic findings suspicious for pelvic inflammatory disease or ovarian torsion seen   Original Report Authenticated By: Rhodia Albright, M.D.    US Pelvis Complete  09/18/2012  *RADIOLOGY REPORT*  Clinical Data:  Left lower quadrant pain. Tested positive for STD today. LMP 09/14/2012.  TRANSABDOMINAL AND TRANSVAGINAL ULTRASOUND OF PELVIS DOPPLER ULTRASOUND OF OVARIES  Technique:  Both transabdominal and transvaginal ultrasound examinations of the pelvis were performed. Transabdominal technique was performed for global imaging of the pelvis including uterus, ovaries, adnexal regions, and pelvic cul-de-sac.  It was necessary to proceed with endovaginal exam following the transabdominal exam to visualize the myometrium, endometrium and adnexa.  Color and duplex Doppler ultrasound was utilized to evaluate blood flow to the ovaries.  Comparison:  None.  Findings:  Uterus:  Is anteverted and anteflexed and demonstrates a sagittal length of 9.8 cm, depth of 5.0 cm and width of 6.3 cm.  A homogeneous myometrium is seen  Endometrium:  Appears thin with a width of 5.6 mm.  No areas of  thickening or heterogeneity are seen  Right ovary: Measures 3.5 x 1.9 x 2.8 cm and has a normal appearance with a corpus luteum.  Flow is identified within the ovary with color Doppler evaluation.  Left ovary:   Measures 2.8 x 2.5 x 2.7 cm and has a normal appearance.  Flow is seen within the ovarian stroma with color Doppler assessment  Pulsed Doppler evaluation demonstrates normal low-resistance arterial and venous waveforms in both ovaries.  Other: No pelvic fluid or separate adnexal masses are seen  IMPRESSION: Unremarkable pelvic ultrasound with no sonographic findings suspicious for pelvic inflammatory disease or ovarian torsion seen   Original Report Authenticated By: Rhodia Albright, M.D.    [place] Art/ven Flow Abd Pelv Doppler  09/18/2012  *RADIOLOGY REPORT*  Clinical Data:  Left lower quadrant pain. Tested positive for STD today. LMP 09/14/2012.  TRANSABDOMINAL AND TRANSVAGINAL ULTRASOUND OF PELVIS DOPPLER ULTRASOUND OF OVARIES  Technique:  Both transabdominal and transvaginal ultrasound examinations of the pelvis were performed. Transabdominal technique was performed for global imaging of the pelvis including uterus, ovaries, adnexal regions, and pelvic cul-de-sac.  It was necessary to proceed with endovaginal exam following the transabdominal exam to visualize the myometrium, endometrium and adnexa.  Color and duplex Doppler ultrasound was utilized to evaluate blood flow to the ovaries.  Comparison:  None.  Findings:  Uterus:  Is anteverted and anteflexed and demonstrates a sagittal length of 9.8 cm, depth of 5.0 cm and width of 6.3 cm.  A homogeneous myometrium is seen  Endometrium:  Appears thin with a width of 5.6 mm.  No areas of thickening or heterogeneity are seen  Right ovary: Measures 3.5 x 1.9 x 2.8 cm and has a normal appearance with a corpus luteum.  Flow is identified within the ovary with color Doppler evaluation.  Left ovary:   Measures 2.8 x 2.5 x 2.7 cm and has a normal appearance.  Flow is  seen within the ovarian stroma with color Doppler assessment  Pulsed Doppler evaluation demonstrates normal low-resistance arterial and venous waveforms in both ovaries.  Other: No pelvic fluid or separate adnexal masses are seen  IMPRESSION: Unremarkable pelvic ultrasound with no sonographic findings suspicious for pelvic inflammatory disease or ovarian torsion seen   Original Report Authenticated By: Rhodia Albright, M.D.    Dg Abd Acute W/chest  09/18/2012  *RADIOLOGY REPORT*  Clinical Data:  left side abdominal pain  ACUTE ABDOMEN SERIES (ABDOMEN 2 VIEW & CHEST 1 VIEW)  Comparison: None.  Findings: Cardiomediastinal silhouette is unremarkable.  Mild thoracic dextroscoliosis.  No acute infiltrate or pulmonary edema. There is nonspecific nonobstructive bowel gas pattern.  Some colonic gas is noted.  Pelvic phleboliths are  noted.  No free abdominal air.  IMPRESSION: .  No acute disease.  Nonspecific nonobstructive bowel gas pattern. Mild thoracic dextroscoliosis.   Original Report Authenticated By: Natasha Mead, M.D.       1. Trichomonas   2. Abdominal pain   3. Neutropenia       MDM  Patient with left-sided abdominal and pelvic pain. Her pelvic ultrasound was unremarkable. She does have Trichomonas which I will treat with Flagyl. This could be resulting in her discomfort. She has no other symptoms suggestive of diverticulitis or other abdominal infection. At this point I did not feel CT imaging as needed. I did do a rapid HIV testing given her neutropenia and this was negative. I stressed importance of followup regarding her neutropenia and gave her some outpatient followup possibilities. Otherwise return here if her symptoms worsen.        Rolan Bucco, MD 09/18/12 [phone]

## 2012-09-19 LAB — URINE CULTURE: Colony Count: 5000

## 2012-09-19 LAB — GC/CHLAMYDIA PROBE AMP
CT Probe RNA: NEGATIVE
GC Probe RNA: NEGATIVE

## 2013-04-12 IMAGING — US US ABDOMEN COMPLETE
1 series · 14 of 25 positions shown · non-contrast
Comparison: None.

CLINICAL DATA: Right upper quadrant pain.  Nausea and vomiting.

COMPLETE ABDOMINAL ULTRASOUND

[Series 1: us abdomen complete · 0.26mm/px · 14 of 64 slices shown]
[im 1/64]
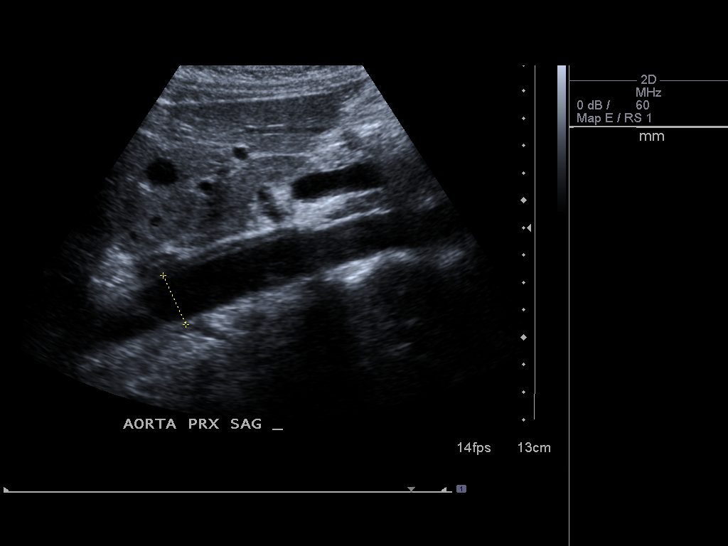
[im 6/64]
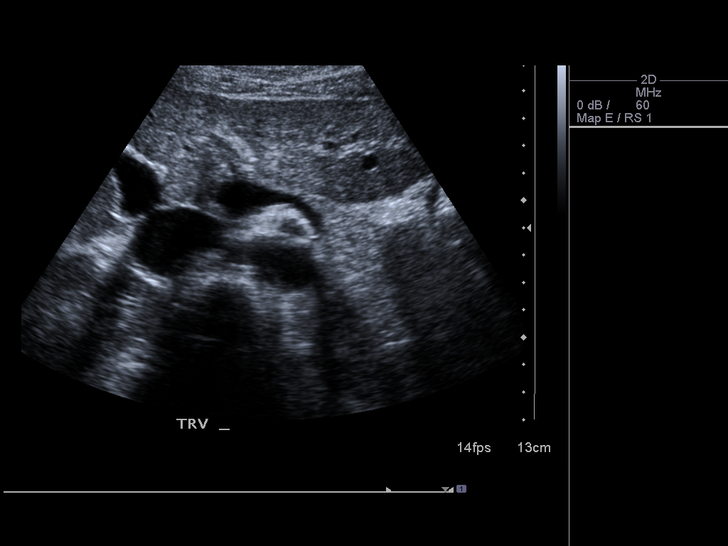
[im 11/64]
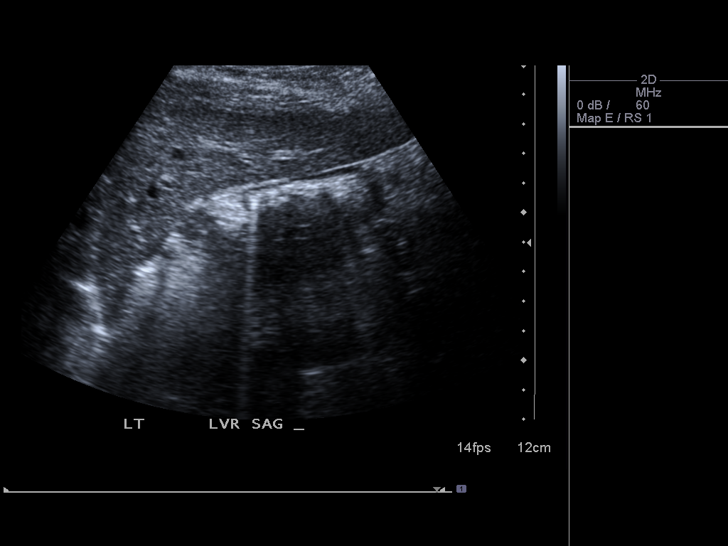
[im 16/64]
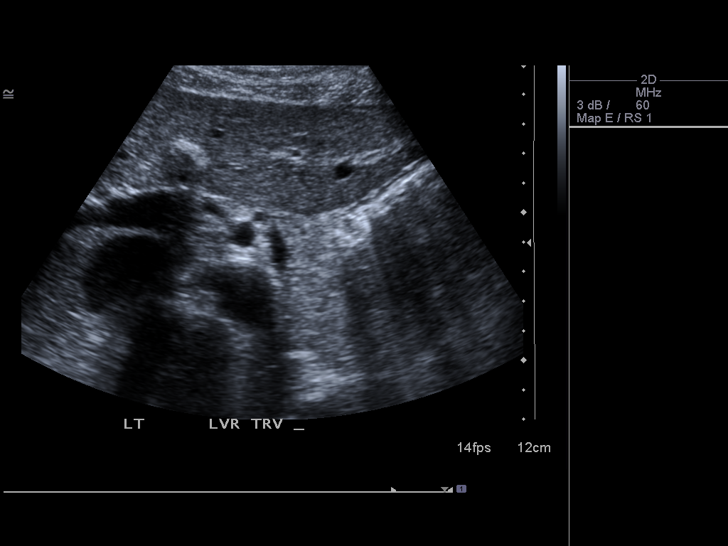
[im 22/64]
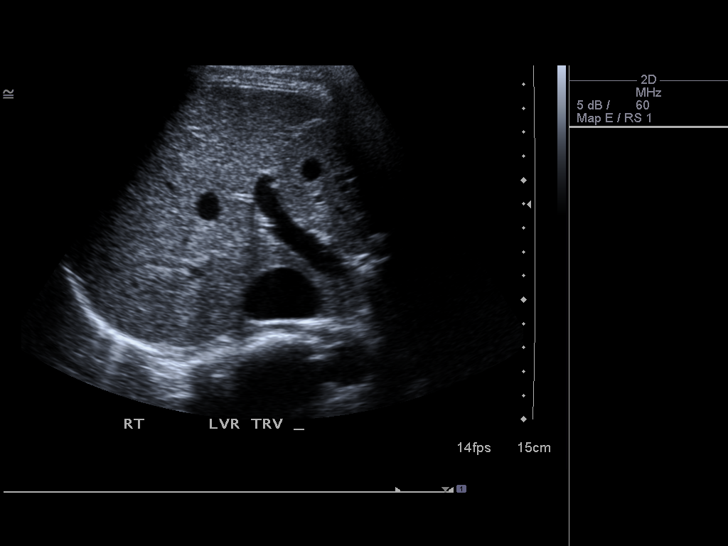
[im 24/64]
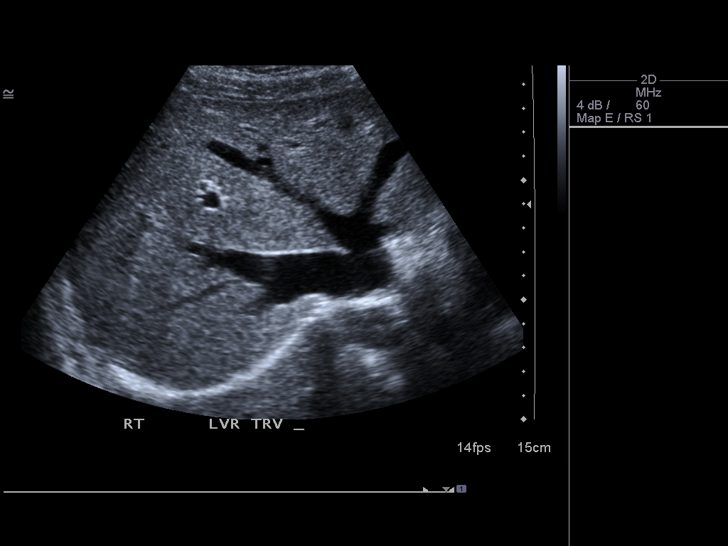
[im 29/64]
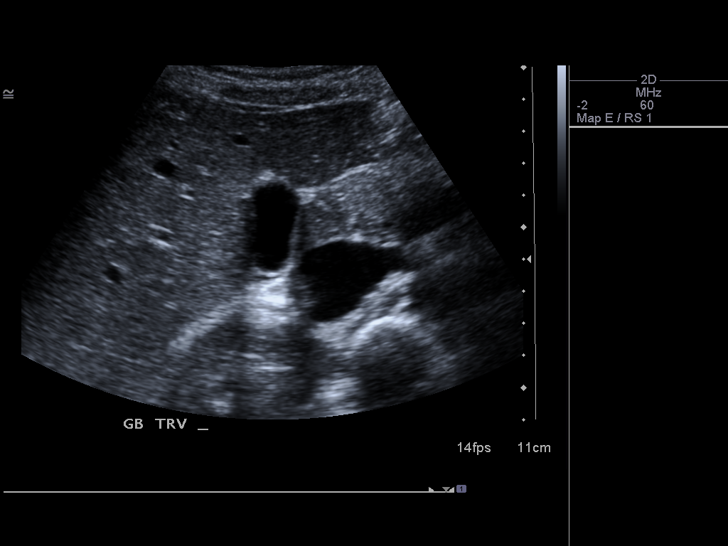
[im 35/64]
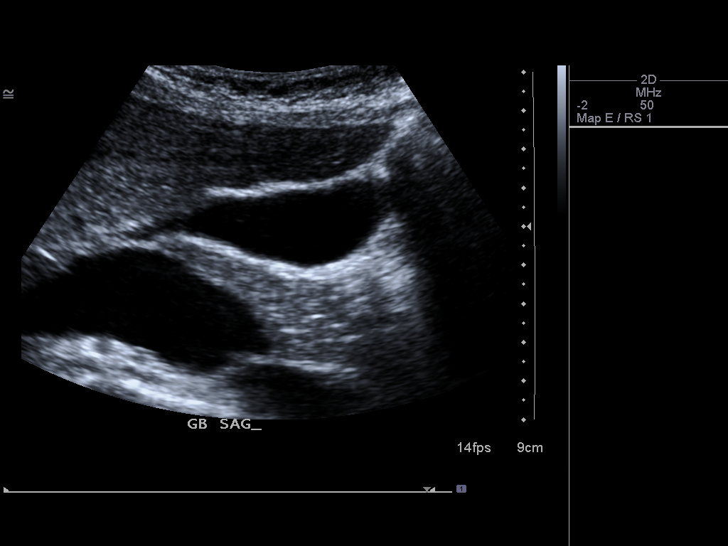
[im 40/64]
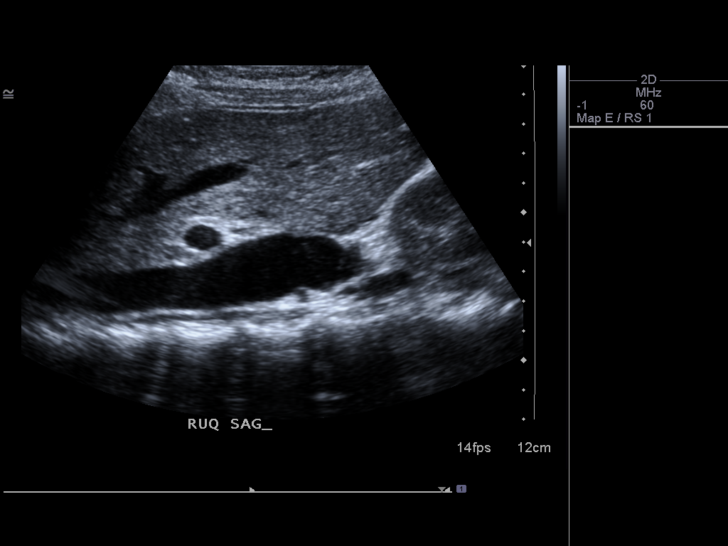
[im 43/64]
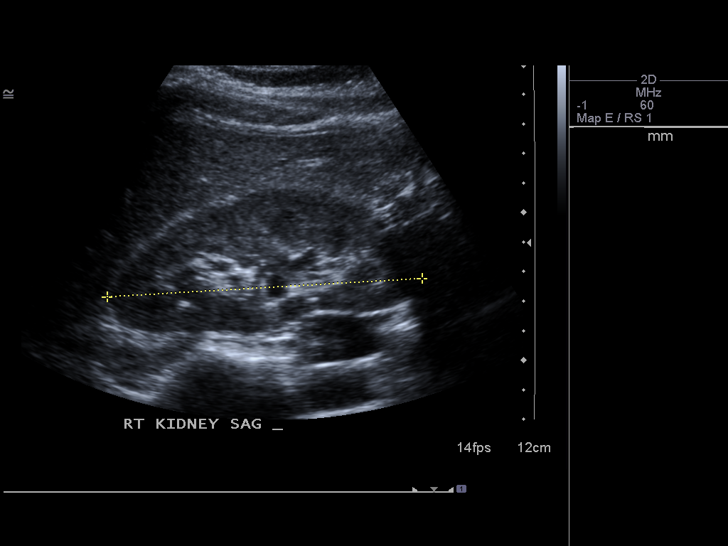
[im 48/64]
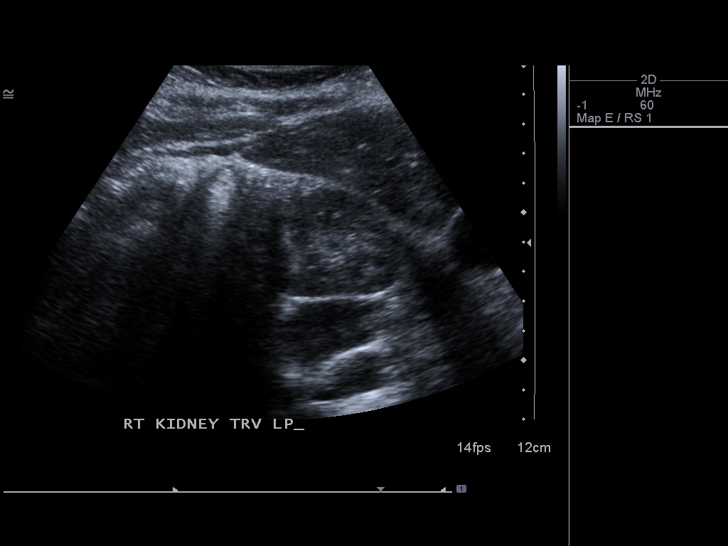
[im 53/64]
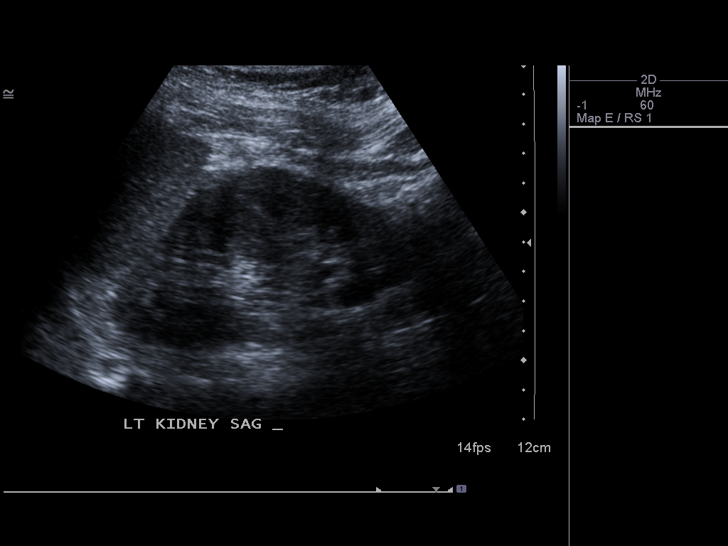
[im 58/64]
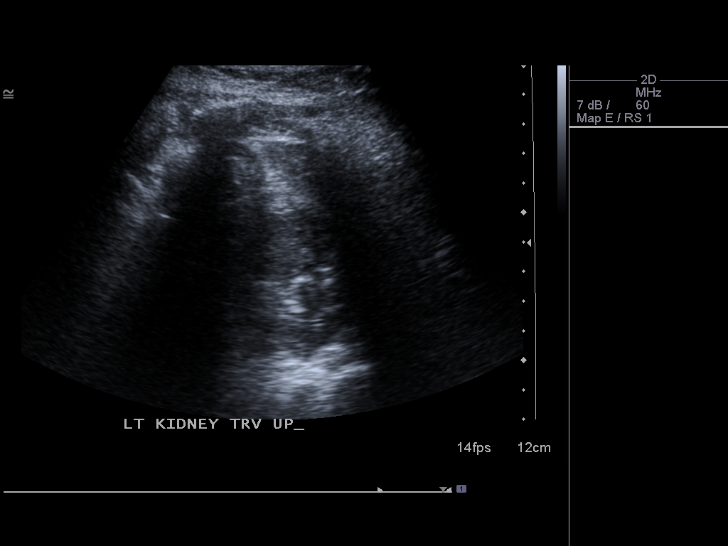
[im 64/64]
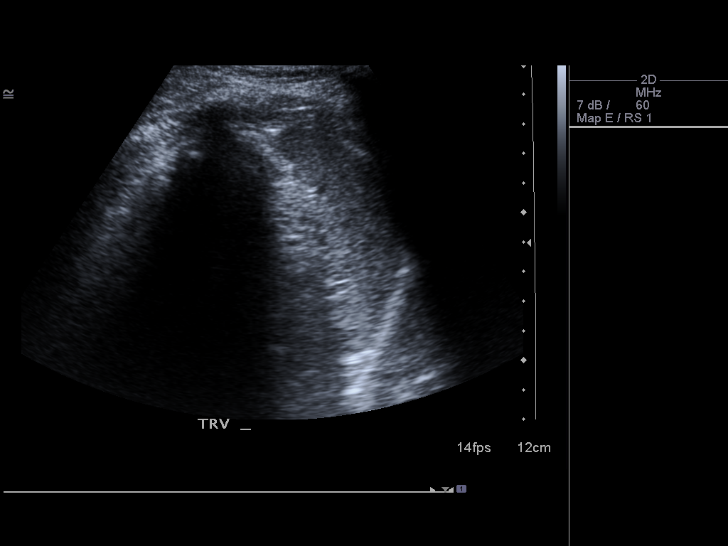

[14 of 25 positions shown; findings below may reference images not displayed]

FINDINGS: Gallbladder:  No gallstones, gallbladder wall thickening, or
pericholecystic fluid. Negative sonographic Murphy's sign.

Common bile duct:  Normal.  1 mm in diameter.

Liver:  Normal.

IVC:  Partially obscured by bowel gas.  The visualized portion is
normal.

Pancreas:  Normal.

Spleen:  Normal.  5.3 cm in length.

Right Kidney:  Normal.  10.7 cm in length. Tiny portion of the
lower pole is obscured by bowel gas.

Left Kidney:  Normal.  10.3 cm in length.

Abdominal aorta:  Partially obscured by bowel gas. 2 cm maximal
diameter.
IMPRESSION: Normal exam.

## 2013-05-02 ENCOUNTER — Encounter (HOSPITAL_BASED_OUTPATIENT_CLINIC_OR_DEPARTMENT_OTHER): Payer: Self-pay | Admitting: Emergency Medicine

## 2013-05-02 ENCOUNTER — Emergency Department (HOSPITAL_BASED_OUTPATIENT_CLINIC_OR_DEPARTMENT_OTHER): Payer: Self-pay

## 2013-05-02 ENCOUNTER — Emergency Department (HOSPITAL_BASED_OUTPATIENT_CLINIC_OR_DEPARTMENT_OTHER)
Admission: EM | Admit: 2013-05-02 | Discharge: 2013-05-02 | Disposition: A | Discharge status: Home / Self Care | Payer: Self-pay | Attending: Emergency Medicine | Admitting: Emergency Medicine

## 2013-05-02 DIAGNOSIS — Z8742 Personal history of other diseases of the female genital tract: Secondary | ICD-10-CM | POA: Insufficient documentation

## 2013-05-02 DIAGNOSIS — J069 Acute upper respiratory infection, unspecified: Secondary | ICD-10-CM | POA: Insufficient documentation

## 2013-05-02 DIAGNOSIS — R071 Chest pain on breathing: Secondary | ICD-10-CM | POA: Insufficient documentation

## 2013-05-02 DIAGNOSIS — R0789 Other chest pain: Secondary | ICD-10-CM

## 2013-05-02 MED ORDER — HYDROCODONE-ACETAMINOPHEN 5-325 MG PO TABS: 2.0000 | ORAL_TABLET | ORAL | Status: DC | PRN

## 2013-05-02 MED ORDER — ALBUTEROL SULFATE HFA 108 (90 BASE) MCG/ACT IN AERS: 2.0000 | INHALATION_SPRAY | RESPIRATORY_TRACT | Status: DC | PRN

## 2013-05-02 NOTE — ED Notes (Signed)
Patient here with congestion, cough, sore throat, and frontal head pressure with headache since am. Reports chills with same, no distress. Did not have flu vaccine.

## 2013-05-02 NOTE — ED Provider Notes (Signed)
CSN: [id number]     Arrival date & time 05/02/13  1113 History   First MD Initiated Contact with Patient 05/02/13 1152     Chief Complaint  Patient presents with  . Nasal Congestion   (Consider location/radiation/quality/duration/timing/severity/associated sxs/prior Treatment) HPI This 27 year old female has one day of some mild chills but no body aches no fever she also has cough with nasal congestion and sore throat with no shortness of breath but has chest pain worse with cough worse with palpation worse with torso movement but is nonexertional she is no abdominal pain no vomiting no diarrhea no rash no confusion no stiff neck no severe headache treatment prior to arrival consists of Robitussin and Aleve over the counter. Past Medical History  Diagnosis Date  . Heart palpitations   . Preeclampsia   . Irregular menstrual bleeding    Past Surgical History  Procedure Laterality Date  . Cesarean section    . Endometrial ablation    . Tubal ligation    . Soft tissue cyst excision    . Wisdom tooth extraction     No family history on file. History  Substance Use Topics  . Smoking status: Never Smoker   . Smokeless tobacco: Not on file  . Alcohol Use: Yes     Comment: socially   OB History   Grav Para Term Preterm Abortions TAB SAB Ect Mult Living                 Review of Systems 10 Systems reviewed and are negative for acute change except as noted in the HPI. Allergies  Ancef  Home Medications   Current Outpatient Rx  Name  Route  Sig  Dispense  Refill  . albuterol (PROVENTIL HFA;VENTOLIN HFA) 108 (90 BASE) MCG/ACT inhaler   Inhalation   Inhale 2 puffs into the lungs every 2 (two) hours as needed for wheezing or shortness of breath (cough).   1 Inhaler   0   . HYDROcodone-acetaminophen (NORCO) 5-325 MG per tablet   Oral   Take 2 tablets by mouth every 4 (four) hours as needed.   10 tablet   0    BP 111/70  Pulse 70  Temp(Src) 97.4 F (36.3 C) (Oral)  Resp  16  Ht 5' (1.524 m)  Wt 132 lb (59.875 kg)  BMI 25.78 kg/m2  SpO2 100%  LMP 04/29/2013 Physical Exam  Nursing note and vitals reviewed. Constitutional:  Awake, alert, nontoxic appearance.  HENT:  Head: Atraumatic.  Mouth/Throat: Oropharynx is clear and moist. No oropharyngeal exudate.  Tympanic membranes clear bilaterally  Eyes: Right eye exhibits no discharge. Left eye exhibits no discharge.  Neck: Neck supple.  Cardiovascular: Normal rate and regular rhythm.   No murmur heard. Pulmonary/Chest: Effort normal and breath sounds normal. No respiratory distress. She has no wheezes. She has no rales. She exhibits tenderness.  Reproducible diffuse chest wall tenderness as well as upper back tenderness with no rash noted  Abdominal: Soft. Bowel sounds are normal. She exhibits no distension and no mass. There is no tenderness. There is no rebound and no guarding.  Musculoskeletal: She exhibits no edema and no tenderness.  Baseline ROM, no obvious new focal weakness.  Neurological: She is alert.  Mental status and motor strength appears baseline for patient and situation.  Skin: No rash noted.  Psychiatric: She has a normal mood and affect.    ED Course  Procedures (including critical care time) Patient informed of clinical course, understand medical decision-making  process, and agree with plan. Labs Review Labs Reviewed - No data to display Imaging Review Dg Chest 2 View  05/02/2013   CLINICAL DATA:  Cough, congestion  EXAM: CHEST  2 VIEW  COMPARISON:  09/18/2012  FINDINGS: The heart size and mediastinal contours are within normal limits. Both lungs are clear. Minimal S-shaped scoliosis identified within the thoracic spine.  IMPRESSION: No active cardiopulmonary disease.   Electronically Signed   By: Salome Holmes M.D.   On: 05/02/2013 12:18    EKG Interpretation   None       MDM   1. URI (upper respiratory infection)   2. Chest wall pain    I doubt any other EMC  precluding discharge at this time including, but not necessarily limited to the following:SBI.    Hurman Horn, MD 05/02/13 [phone]

## 2013-09-15 ENCOUNTER — Emergency Department (HOSPITAL_BASED_OUTPATIENT_CLINIC_OR_DEPARTMENT_OTHER)
Admission: EM | Admit: 2013-09-15 | Discharge: 2013-09-15 | Disposition: A | Discharge status: Home / Self Care | Payer: Self-pay | Attending: Emergency Medicine | Admitting: Emergency Medicine

## 2013-09-15 ENCOUNTER — Encounter (HOSPITAL_BASED_OUTPATIENT_CLINIC_OR_DEPARTMENT_OTHER): Payer: Self-pay | Admitting: Emergency Medicine

## 2013-09-15 ENCOUNTER — Emergency Department (HOSPITAL_BASED_OUTPATIENT_CLINIC_OR_DEPARTMENT_OTHER): Payer: Self-pay

## 2013-09-15 DIAGNOSIS — R112 Nausea with vomiting, unspecified: Secondary | ICD-10-CM | POA: Insufficient documentation

## 2013-09-15 DIAGNOSIS — Z3202 Encounter for pregnancy test, result negative: Secondary | ICD-10-CM | POA: Insufficient documentation

## 2013-09-15 DIAGNOSIS — R51 Headache: Secondary | ICD-10-CM | POA: Insufficient documentation

## 2013-09-15 DIAGNOSIS — R102 Pelvic and perineal pain: Secondary | ICD-10-CM

## 2013-09-15 DIAGNOSIS — Z79899 Other long term (current) drug therapy: Secondary | ICD-10-CM | POA: Insufficient documentation

## 2013-09-15 DIAGNOSIS — Z87440 Personal history of urinary (tract) infections: Secondary | ICD-10-CM | POA: Insufficient documentation

## 2013-09-15 DIAGNOSIS — N949 Unspecified condition associated with female genital organs and menstrual cycle: Secondary | ICD-10-CM | POA: Insufficient documentation

## 2013-09-15 LAB — CBC WITH DIFFERENTIAL/PLATELET
BASOS ABS: 0 10*3/uL (ref 0.0–0.1)
Basophils Relative: 0 % (ref 0–1)
Eosinophils Absolute: 0 10*3/uL (ref 0.0–0.7)
Eosinophils Relative: 1 % (ref 0–5)
HCT: 38.9 % (ref 36.0–46.0)
Hemoglobin: 12.8 g/dL (ref 12.0–15.0)
LYMPHS PCT: 54 % — AB (ref 12–46)
Lymphs Abs: 2.1 10*3/uL (ref 0.7–4.0)
MCH: 29.4 pg (ref 26.0–34.0)
MCHC: 32.9 g/dL (ref 30.0–36.0)
MCV: 89.4 fL (ref 78.0–100.0)
Monocytes Absolute: 0.4 10*3/uL (ref 0.1–1.0)
Monocytes Relative: 10 % (ref 3–12)
NEUTROS ABS: 1.4 10*3/uL — AB (ref 1.7–7.7)
NEUTROS PCT: 36 % — AB (ref 43–77)
PLATELETS: 149 10*3/uL — AB (ref 150–400)
RBC: 4.35 MIL/uL (ref 3.87–5.11)
RDW: 13.4 % (ref 11.5–15.5)
WBC: 3.9 10*3/uL — AB (ref 4.0–10.5)

## 2013-09-15 LAB — COMPREHENSIVE METABOLIC PANEL
ALK PHOS: 32 U/L — AB (ref 39–117)
ALT: 9 U/L (ref 0–35)
AST: 15 U/L (ref 0–37)
Albumin: 4.2 g/dL (ref 3.5–5.2)
BILIRUBIN TOTAL: 0.3 mg/dL (ref 0.3–1.2)
BUN: 10 mg/dL (ref 6–23)
CALCIUM: 9.7 mg/dL (ref 8.4–10.5)
CHLORIDE: 104 meq/L (ref 96–112)
CO2: 28 meq/L (ref 19–32)
Creatinine, Ser: 0.7 mg/dL (ref 0.50–1.10)
GFR calc Af Amer: >90 mL/min (ref >90)
Glucose, Bld: 98 mg/dL (ref 70–99)
POTASSIUM: 4 meq/L (ref 3.7–5.3)
SODIUM: 143 meq/L (ref 137–147)
Total Protein: 7 g/dL (ref 6.0–8.3)

## 2013-09-15 LAB — URINALYSIS, ROUTINE W REFLEX MICROSCOPIC
Bilirubin Urine: NEGATIVE
Glucose, UA: NEGATIVE mg/dL
Hgb urine dipstick: NEGATIVE
Ketones, ur: NEGATIVE mg/dL
Leukocytes, UA: NEGATIVE
NITRITE: NEGATIVE
PH: 8 (ref 5.0–8.0)
Protein, ur: NEGATIVE mg/dL
SPECIFIC GRAVITY, URINE: 1.017 (ref 1.005–1.030)
UROBILINOGEN UA: 0.2 mg/dL (ref 0.0–1.0)

## 2013-09-15 LAB — PREGNANCY, URINE: PREG TEST UR: NEGATIVE

## 2013-09-15 LAB — LIPASE, BLOOD: Lipase: 15 U/L (ref 11–59)

## 2013-09-15 MED ORDER — IOHEXOL 300 MG/ML  SOLN
50.0000 mL | Freq: Once | INTRAMUSCULAR | Status: AC | PRN
  Administered 2013-09-15: 50 mL via ORAL

## 2013-09-15 MED ORDER — ONDANSETRON HCL 4 MG/2ML IJ SOLN
4.0000 mg | Freq: Once | INTRAMUSCULAR | Status: AC
  Administered 2013-09-15: 4 mg via INTRAVENOUS

## 2013-09-15 MED ORDER — ONDANSETRON HCL 4 MG/2ML IJ SOLN
4.0000 mg | Freq: Once | INTRAMUSCULAR | Status: AC
  Administered 2013-09-15: 4 mg via INTRAVENOUS
  Filled 2013-09-15: qty 2

## 2013-09-15 MED ORDER — HYDROMORPHONE HCL PF 1 MG/ML IJ SOLN
1.0000 mg | Freq: Once | INTRAMUSCULAR | Status: AC
  Administered 2013-09-15: 1 mg via INTRAVENOUS
  Filled 2013-09-15: qty 1

## 2013-09-15 MED ORDER — SODIUM CHLORIDE 0.9 % IV BOLUS (SEPSIS)
1000.0000 mL | Freq: Once | INTRAVENOUS | Status: AC
  Administered 2013-09-15: 1000 mL via INTRAVENOUS

## 2013-09-15 MED ORDER — ONDANSETRON HCL 4 MG/2ML IJ SOLN
INTRAMUSCULAR | Status: AC
  Filled 2013-09-15: qty 2

## 2013-09-15 MED ORDER — IOHEXOL 300 MG/ML  SOLN
100.0000 mL | Freq: Once | INTRAMUSCULAR | Status: AC | PRN
  Administered 2013-09-15: 100 mL via INTRAVENOUS

## 2013-09-15 MED ORDER — ONDANSETRON 8 MG PO TBDP: 8.0000 mg | ORAL_TABLET | Freq: Three times a day (TID) | ORAL | Status: DC | PRN

## 2013-09-15 NOTE — ED Notes (Signed)
Patient transported to CT 

## 2013-09-15 NOTE — ED Notes (Signed)
MD at bedside. 

## 2013-09-15 NOTE — ED Notes (Signed)
Abdominal pain that started yesterday and generalized weakness and not feeling well.  Recently treated for a UTI and still taking Macrobid.  Chills noted and reports a headache that started 1 week ago.

## 2013-09-15 NOTE — ED Provider Notes (Signed)
CSN: [id number]     Arrival date & time 09/15/13  1109 History   First MD Initiated Contact with Patient 09/15/13 1158     Chief Complaint  Patient presents with  . Abdominal Pain     (Consider location/radiation/quality/duration/timing/severity/associated sxs/prior Treatment) HPI 28 year old female comes in today complaining of crampy lower, pain for several days. She states she was seen [facility] and had a full exam including a pelvic exam. She states she had cultures taken and had report there were no abnormalities. She has not had any vaginal discharge. She had some urinary tract infection symptoms but treated for this and feels that those have improved. She states she was told a year ago her white blood cell count was somewhat low and that she should be seen in followup for this but has not had any followup. She states she had HIV testing at that time that was negative. She has had some nausea, vomiting, and one loose stool. She also reports some headache and generalized. She has no known sick contacts. She states her periods are irregular. Past Medical History  Diagnosis Date  . Heart palpitations   . Preeclampsia   . Irregular menstrual bleeding    Past Surgical History  Procedure Laterality Date  . Cesarean section    . Endometrial ablation    . Tubal ligation    . Soft tissue cyst excision    . Wisdom tooth extraction     No family history on file. History  Substance Use Topics  . Smoking status: Never Smoker   . Smokeless tobacco: Not on file  . Alcohol Use: Yes     Comment: socially   OB History   Grav Para Term Preterm Abortions TAB SAB Ect Mult Living                 Review of Systems  All other systems reviewed and are negative.     Allergies  Ancef  Home Medications   Prior to Admission medications   Medication Sig Start Date End Date Taking? Authorizing Provider  Nitrofurantoin Monohyd Macro (MACROBID PO) Take by  mouth.   Yes Historical Provider, MD  albuterol (PROVENTIL HFA;VENTOLIN HFA) 108 (90 BASE) MCG/ACT inhaler Inhale 2 puffs into the lungs every 2 (two) hours as needed for wheezing or shortness of breath (cough). 05/02/13   Hurman HornJohn M Bednar, MD  HYDROcodone-acetaminophen (NORCO) 5-325 MG per tablet Take 2 tablets by mouth every 4 (four) hours as needed. 05/02/13   Hurman HornJohn M Bednar, MD   BP 103/63  Pulse 71  Temp(Src) 97.6 F (36.4 C) (Oral)  Resp 16  Ht 5' (1.524 m)  Wt 138 lb (62.596 kg)  BMI 26.95 kg/m2  SpO2 100%  LMP 09/04/2013 Physical Exam  Nursing note and vitals reviewed. Constitutional: She is oriented to person, place, and time. She appears well-developed and well-nourished.  HENT:  Head: Normocephalic and atraumatic.  Right Ear: External ear normal.  Left Ear: External ear normal.  Nose: Nose normal.  Mouth/Throat: Oropharynx is clear and moist.  Eyes: Conjunctivae and EOM are normal. Pupils are equal, round, and reactive to light.  Neck: Normal range of motion. Neck supple.  Cardiovascular: Normal rate, regular rhythm, normal heart sounds and intact distal pulses.   Pulmonary/Chest: Effort normal and breath sounds normal.  Abdominal: Soft. Bowel sounds are normal.  Mild diffuse tenderness to palpation in lower abdomen  Musculoskeletal: Normal range of motion.  Neurological: She is alert and  oriented to person, place, and time. She has normal reflexes.  Skin: Skin is warm and dry.  Psychiatric: She has a normal mood and affect. Her behavior is normal. Judgment and thought content normal.    ED Course  Procedures (including critical care time) Labs Review Labs Reviewed  URINALYSIS, ROUTINE W REFLEX MICROSCOPIC - Abnormal; Notable for the following:    APPearance CLOUDY (*)    All other components within normal limits  CBC WITH DIFFERENTIAL - Abnormal; Notable for the following:    WBC 3.9 (*)    Platelets 149 (*)    Neutrophils Relative % 36 (*)    Neutro Abs 1.4 (*)     Lymphocytes Relative 54 (*)    All other components within normal limits  COMPREHENSIVE METABOLIC PANEL - Abnormal; Notable for the following:    Alkaline Phosphatase 32 (*)    All other components within normal limits  PREGNANCY, URINE  LIPASE, BLOOD    Imaging Review Ct Abdomen Pelvis W Contrast  09/15/2013   CLINICAL DATA:  abdominal pain  EXAM: CT ABDOMEN AND PELVIS WITH CONTRAST  TECHNIQUE: Multidetector CT imaging of the abdomen and pelvis was performed using the standard protocol following bolus administration of intravenous contrast.  CONTRAST:  50mL OMNIPAQUE IOHEXOL 300 MG/ML SOLN, 100mL OMNIPAQUE IOHEXOL 300 MG/ML SOLN  COMPARISON:  None.  FINDINGS: The lung bases are unremarkable.  The liver, spleen, adrenals, pancreas, kidneys are unremarkable. The gallbladder gallbladder fossa are negative.  There is no evidence of abdominal aortic aneurysm. The celiac, SMA, IMA, portal vein, SMV are opacified.  The bowel is negative. The appendix is identified and is unremarkable.  A small amount of free fluid is appreciated within the pelvis.  There is no evidence of abdominal or pelvic masses, adenopathy, or loculated fluid collections.  No abdominal wall or inguinal hernia appreciated. There are no aggressive appearing osseous lesions.  IMPRESSION: Small amount of free fluid within the pelvis likely physiologic, an indolent inflammatory or infectious process cannot be excluded. Otherwise there is no evidence of abdominal or pelvic pathology.   Electronically Signed   By: Salome HolmesHector  Cooper M.D.   On: 09/15/2013 13:25     EKG Interpretation None      MDM   Final diagnoses:  None    Patient states pain has resolved but continues to feel generally weak and achy. I discussed the lab results and CT findings with the patient. She'll be discharged with Zofran for nausea. She is given referral for followup. I have discussed need for followup and return precautions with her and she voices  understanding.   Hilario Quarryanielle S Albaraa Swingle, MD 09/15/13 1430

## 2013-09-15 NOTE — Discharge Instructions (Signed)
°Emergency Department Resource Guide °1) Find a Doctor and Pay Out of Pocket °Although you won't have to find out who is covered by your insurance plan, it is a good idea to ask around and get recommendations. You will then need to call the office and see if the doctor you have chosen will accept you as a new patient and what types of options they offer for patients who are self-pay. Some doctors offer discounts or will set up payment plans for their patients who do not have insurance, but you will need to ask so you aren't surprised when you get to your appointment. ° °2) Contact Your Local Health Department °Not all health departments have doctors that can see patients for sick visits, but many do, so it is worth a call to see if yours does. If you don't know where your local health department is, you can check in your phone book. The CDC also has a tool to help you locate your state's health department, and many state websites also have listings of all of their local health departments. ° °3) Find a Walk-in Clinic °If your illness is not likely to be very severe or complicated, you may want to try a walk in clinic. These are popping up all over the country in pharmacies, drugstores, and shopping centers. They're usually staffed by nurse practitioners or physician assistants that have been trained to treat common illnesses and complaints. They're usually fairly quick and inexpensive. However, if you have serious medical issues or chronic medical problems, these are probably not your best option. ° °No Primary Care Doctor: °- Call Health Connect at  832-8000 - they can help you locate a primary care doctor that  accepts your insurance, provides certain services, etc. °- Physician Referral Service- 1-800-533-3463 ° °Chronic Pain Problems: °Organization         Address  Phone   Notes  °[facility]  (336) 297-2271 Patients need to be referred by their primary care doctor.  ° °Medication  Assistance: °Organization         Address  Phone   Notes  °[place] [facility] [address] °[place], [place] [address] (336) 641-8030 --Must be a resident of [place] °-- Must have NO insurance coverage whatsoever (no Medicaid/ Medicare, etc.) °-- The pt. MUST have a primary care doctor that directs their care regularly and follows them in the community °  °[facility]  (866) 331-1348   °[employer]  (888) 892-1162   ° °Agencies that provide inexpensive medical care: °Organization         Address  Phone   Notes  °[facility]  (336) 832-8035   °[facility]    (336) 832-7272   °[facility] [address] °[place], [place] [address] (336) 832-4777   °[facility] [address], °[place] (336) 271-4999   °[facility]    (336) 373-0678   °[facility]    (336) 272-1050   °[facility] ° [address], [place] Phone:  (336) 832-4444, Fax:  (336) 832-4440 Hours of Operation:  9 am - 6 pm, M-F.  Also accepts Medicaid/Medicare and self-pay.  °[facility] ° [address], [place] Phone: (336) 832-3150, Fax: (336) [phone]. Hours of Operation:  8:30 am - 5:30 pm, M-F.  Also accepts Medicaid and self-pay.  °[facility] [place] [address], [place] Phone: (336) 878-6027   °[facility] [address], [place], [place] (336)723-1848, Ext. 123 Mondays & Thursdays: 7-9 AM.  First 15 patients are seen on a first come, first serve basis. °  ° °Medicaid-accepting [place] Providers: ° °Organization         Address  Phone   Notes  °[facility] [address], [place] (336) 641-2100 Also accepts self-pay patients.  °[facility] [address], [place] ° (336) 856-9996   °[facility] [address], [place]  (336) 288-8857   °[facility] [address] [place] [address], [place] (336) 299-7000   °Veita Bland [address], [place]  ° (336) 373-1557 Only accepts [place] Access Medicaid patients after they have their name applied to their card.  ° °Self-Pay (no insurance) in [place]: ° °Organization         Address  Phone   Notes  °Sickle Cell Patients, [facility] [address], [place] (336) 832-1970   °[facility] [address], [place] (336) 832-4400   °[facility] ° [address], [place] (336) 992-4800   °[facility]/Dr. Osei-Bonsu ° [address] [place] [address], [place] or [address], [place] (336) 841-8500 Phone number for both [place] and [place] locations is the same.  °[facility] [address], [place] (336) 299-0000   °[facility] [address] [place] [address], [place] or [address] (336) 852-7530 °(336) 878-2260   °[facility] [address], [place] (336) 350-1642, phone; (336) 294-5005, fax Sees patients 1st and 3rd Saturday of every month.  Must not qualify for public or private insurance (i.e. Medicaid, Medicare, [place] Health Choice, Veterans' Benefits) • Household income should be no more than 200% of the poverty level •The clinic cannot treat you if you are pregnant or think you are pregnant • Sexually transmitted diseases are not treated at the clinic.  ° ° °Dental Care: °Organization         Address  Phone  Notes  °[place] [facility] [address], [place] (336) 641-6152 Accepts children up to age 21 who are enrolled in Medicaid or [place] Health Choice; pregnant women with a Medicaid card; and children who have applied for Medicaid or [place] Health Choice, but were declined, whose parents can pay a reduced fee at time of service.  °[place]  [facility] [place]  [address], [place] (336) 641-7733 Accepts children up to age 21 who are enrolled in Medicaid or [place] Health Choice; pregnant women with a Medicaid card; and children who have applied for Medicaid or [place] Health Choice, but were declined, whose parents can pay a reduced fee at time of service.  °[facility] ° [address], [place] (336) 641-4533 Patients are seen by appointment only. Walk-ins are not accepted. [facility] will see patients 18 years of age and older. °Monday - Tuesday (8am-5pm) °Most Wednesdays (8:30-5pm) °$30 per visit, cash only  °[facility] ° [address], [place] (336) 641-4533 Patients are seen by appointment only. Walk-ins are not accepted. [facility] will see patients 18 years of age and older. °One   Wednesday Evening (Monthly: Volunteer Based).  $30 per visit, cash only  °[facility]  (919) 537-3737 for adults; Children under age 4, call Graduate Pediatric Dentistry at ([phone]) [phone]. Children aged 4-14, please call ([phone]) [phone] to request a pediatric application. ° Dental services are provided in all areas of dental care including fillings, crowns and bridges, complete and partial dentures, implants, gum treatment, root canals, and extractions. Preventive care is also provided. Treatment is provided to both adults and children. °Patients are selected via a lottery and there is often a waiting list. °  °[facility] [address], °[place] ° (336) 763-8833 [email or web address] °  °[facility] [address], [place], [place] (336)723-1848, Ext. 123 Second and Fourth Thursday of each month, opens at 6:30 AM; Clinic ends at 9 AM.  Patients are seen on a first-come first-served basis, and a limited number are seen during each clinic.  ° °[facility] ° [address], [place], [place] (336) 723-7904    Eligibility Requirements °You must have lived in [place], [place], or [place] counties for at least the last three months. °  You cannot be eligible for state or federal sponsored healthcare insurance, including Veterans Administration, Medicaid, or Medicare. °  You generally cannot be eligible for healthcare insurance through your employer.  °  How to apply: °Eligibility screenings are held every Tuesday and Wednesday afternoon from 1:00 pm until 4:00 pm. You do not need an appointment for the interview!  °[facility] [address], [place], [place] 336-631-2330   °[facility]  336-342-8273   °[facility]  336-703-3100   °[facility]  336-570-6415   ° °Behavioral Health Resources in the Community: °Intensive Outpatient Programs °Organization         Address  Phone  Notes  °[facility] [address], [place], [place] 336-878-6098   °[facility] [address], [place], [place] 336-832-9800   °[facility] [address], [place], [place] ° 336-882-2125   °[facility] [address],  °[place], [place] 1-800-853-5163 or 336-641-4981   °Substance Abuse Resources °Organization         Address  Phone  Notes  °[facility]  336-882-2125   °[facility]  336-784-9470   °[facility]  336-285-9073   °[facility]  336-845-3988   °Residential & Outpatient Substance Abuse Program  1-800-659-3381   °Psychological Services °Organization         Address  Phone  Notes  °[facility]  336- 832-9600   °[facility]  336- 378-7881   °[facility] [address], [place] 1-800-853-5163 or 336-641-4981   ° °Mobile Crisis Teams °Organization         Address  Phone  Notes  °[facility], Mobile Crisis Care Unit  1-877-626-1772   °Assertive °[facility] ° [address]  [place], [place] 336-834-9664   °Sharon DeEsch [address] °[place] 336-554-5454   ° °Self-Help/Support Groups °Organization         Address  Phone             Notes  °[facility] of  - variety of support groups  336- 373-1402 Call for more information  °Narcotics Anonymous (NA), [facility] [address], °[place]  2 meetings at this location  ° °  Residential Treatment Programs Organization         Address  Phone  Notes  [facility] [address],    [place]  [phone]   [facility]  [address], [place] [phone], [place], [place] [phone]   [facility] [address], [place] [phone] Admissions: 8am-3pm M-F  [facility] [address]    [place], [place] 295-284-1324(317) 854-5460   [facility] [address], [place], [place] [phone]   [facility] [address]  [place], [place] [phone]   [facility] Intensive Outpatient [address], [place], [place] [phone]   [facility] ([facility] [address]  [place], [place] [phone] or ([phone]   [facility] ([facility]) [address] [place], [place] [phone] Accepts Medicaid  [facility] [address]  [place] [phone] Substance Abuse/Addiction Treatment   [facility] Organization         Address  Phone  Notes  [facility]  412-235-0838(888) 401-426-8547   Angie FavaJulie Brannon, PhD [address], [address] [place], [place]   [phone](336) [phone] or [phone](336) (705)[phone]   [facility]   [address] [place], [place] [phone](336) (914) [phone]   [facility] [address], [place], [place] 332-738-4474(336) (641)[phone] Insurance/Medicaid/sponsorship through [facility]  [facility] [address]                                    [place], [place] [phone](336) ([phone] Therapy/tele-psych/case    [facility] [address]   [place], [place] [phone](336) (505) [phone]    Dr. Lolly MustacheArfeen  [phone](336) [phone]   [facility]  [employer] [facility] 1) [address], [place] 2) [address], [place] 3)  [address], [place] ([phone](336) ([phone](336) ([phone]) [phone]  [phone](336) [phone]   [facility] Child [facility] Hot[facility] 570-555-3853(336) [phone] or 705-222-2782(336) 801-758-1307 (After Hours)      Pelvic Pain, Female Female pelvic pain can be caused by many different things and start from a variety of places. Pelvic pain refers to pain that is located in the lower half of the abdomen and between your hips. The pain may occur over a short period of time (acute) or may be reoccurring (chronic). The cause of pelvic pain may be related to disorders affecting the female reproductive organs (gynecologic), but it may also be related to the bladder, kidney stones, an intestinal complication, or muscle or skeletal problems. Getting help right away for pelvic pain is important, especially if there has been severe, sharp, or a sudden onset of unusual pain. It is also important to get help right away because some types of pelvic pain can be life threatening.  CAUSES  Below are only some of the causes of pelvic pain. The causes of pelvic pain can be in one of several categories.   Gynecologic.  Pelvic inflammatory disease.  Sexually transmitted infection.  Ovarian cyst or a twisted ovarian ligament (ovarian torsion).  Uterine lining that grows outside the uterus (endometriosis).  Fibroids, cysts, or tumors.  Ovulation.  Pregnancy.  Pregnancy that occurs outside the uterus (ectopic pregnancy).  Miscarriage.  Labor.  Abruption of the placenta or ruptured uterus.  Infection.  Uterine infection (endometritis).  Bladder infection.  Diverticulitis.  Miscarriage related to  a uterine infection (septic abortion).  Bladder.  Inflammation of the bladder (cystitis).  Kidney  stone(s).  Gastrointenstinal.  Constipation.  Diverticulitis.  Neurologic.  Trauma.  Feeling pelvic pain because of mental or emotional causes (psychosomatic).  Cancers of the bowel or pelvis. EVALUATION  Your caregiver will want to take a careful history of your concerns. This includes recent changes in your health, a careful gynecologic history of your periods (menses), and a sexual history. Obtaining your family history and medical history is also important. Your caregiver may suggest a pelvic exam. A pelvic exam will help identify the location and severity of the pain. It also helps in the evaluation of which organ system may be involved. In order to identify the cause of the pelvic pain and be properly treated, your caregiver may order tests. These tests may include:   A pregnancy test.  Pelvic ultrasonography.  An X-Nalea Salce exam of the abdomen.  A urinalysis or evaluation of vaginal discharge.  Blood tests. HOME CARE INSTRUCTIONS   Only take over-the-counter or prescription medicines for pain, discomfort, or fever as directed by your caregiver.   Rest as directed by your caregiver.   Eat a balanced diet.   Drink enough fluids to make your urine clear or pale yellow, or as directed.   Avoid sexual intercourse if it causes pain.   Apply warm or cold compresses to the lower abdomen depending on which one helps the pain.   Avoid stressful situations.   Keep a journal of your pelvic pain. Write down when it started, where the pain is located, and if there are things that seem to be associated with the pain, such as food or your menstrual cycle.  Follow up with your caregiver as directed.  SEEK MEDICAL CARE IF:  Your medicine does not help your pain.  You have abnormal vaginal discharge. SEEK IMMEDIATE MEDICAL CARE IF:   You have heavy bleeding from the vagina.   Your pelvic pain increases.   You feel lightheaded or faint.   You have chills.   You  have pain with urination or blood in your urine.   You have uncontrolled diarrhea or vomiting.   You have a fever or persistent symptoms for more than 3 days.  You have a fever and your symptoms suddenly get worse.   You are being physically or sexually abused.  MAKE SURE YOU:  Understand these instructions.  Will watch your condition.  Will get help if you are not doing well or get worse. Document Released: 04/03/2004 Document Revised: 11/06/2011 Document Reviewed: 08/27/2011 [facility] Patient Information 2014 [place] [place].

## 2014-02-24 ENCOUNTER — Emergency Department (HOSPITAL_BASED_OUTPATIENT_CLINIC_OR_DEPARTMENT_OTHER)
Admission: EM | Admit: 2014-02-24 | Discharge: 2014-02-24 | Disposition: A | Discharge status: Home / Self Care | Payer: Self-pay | Attending: Emergency Medicine | Admitting: Emergency Medicine

## 2014-02-24 ENCOUNTER — Encounter (HOSPITAL_BASED_OUTPATIENT_CLINIC_OR_DEPARTMENT_OTHER): Payer: Self-pay | Admitting: Emergency Medicine

## 2014-02-24 ENCOUNTER — Emergency Department (HOSPITAL_BASED_OUTPATIENT_CLINIC_OR_DEPARTMENT_OTHER): Payer: Self-pay

## 2014-02-24 DIAGNOSIS — M79601 Pain in right arm: Secondary | ICD-10-CM

## 2014-02-24 DIAGNOSIS — Z792 Long term (current) use of antibiotics: Secondary | ICD-10-CM | POA: Insufficient documentation

## 2014-02-24 DIAGNOSIS — R200 Anesthesia of skin: Secondary | ICD-10-CM | POA: Insufficient documentation

## 2014-02-24 DIAGNOSIS — Z8742 Personal history of other diseases of the female genital tract: Secondary | ICD-10-CM | POA: Insufficient documentation

## 2014-02-24 MED ORDER — IBUPROFEN 800 MG PO TABS
800.0000 mg | ORAL_TABLET | Freq: Once | ORAL | Status: AC
  Administered 2014-02-24: 800 mg via ORAL
  Filled 2014-02-24: qty 1

## 2014-02-24 MED ORDER — IBUPROFEN 800 MG PO TABS: 800.0000 mg | ORAL_TABLET | Freq: Three times a day (TID) | ORAL | Status: DC

## 2014-02-24 NOTE — Discharge Instructions (Signed)
Heat Therapy Heat therapy can help ease sore, stiff, injured, and tight muscles and joints. Heat relaxes your muscles, which may help ease your pain.  RISKS AND COMPLICATIONS If you have any of the following conditions, do not use heat therapy unless your health care provider has approved:  Poor circulation.  Healing wounds or scarred skin in the area being treated.  Diabetes, heart disease, or high blood pressure.  Not being able to feel (numbness) the area being treated.  Unusual swelling of the area being treated.  Active infections.  Blood clots.  Cancer.  Inability to communicate pain. This may include young children and people who have problems with their brain function (dementia).  Pregnancy. Heat therapy should only be used on old, pre-existing, or long-lasting (chronic) injuries. Do not use heat therapy on new injuries unless directed by your health care provider. HOW TO USE HEAT THERAPY There are several different kinds of heat therapy, including:  Moist heat pack.  Warm water bath.  Hot water bottle.  Electric heating pad.  Heated gel pack.  Heated wrap.  Electric heating pad. Use the heat therapy method suggested by your health care provider. Follow your health care provider's instructions on when and how to use heat therapy. GENERAL HEAT THERAPY RECOMMENDATIONS  Do not sleep while using heat therapy. Only use heat therapy while you are awake.  Your skin may turn pink while using heat therapy. Do not use heat therapy if your skin turns red.  Do not use heat therapy if you have new pain.  High heat or long exposure to heat can cause burns. Be careful when using heat therapy to avoid burning your skin.  Do not use heat therapy on areas of your skin that are already irritated, such as with a rash or sunburn. SEEK MEDICAL CARE IF:  You have blisters, redness, swelling, or numbness.  You have new pain.  Your pain is worse. MAKE SURE  YOU:  Understand these instructions.  Will watch your condition.  Will get help right away if you are not doing well or get worse. Document Released: 07/30/2011 Document Revised: 09/21/2013 Document Reviewed: 06/30/2013 [facility] Patient Information 2015 [place] [place]. This information is not intended to replace advice given to you by your health care provider. Make sure you discuss any questions you have with your health care provider.  Emergency Department Resource Guide 1) Find a Doctor and Pay Out of Pocket Although you won't have to find out who is covered by your insurance plan, it is a good idea to ask around and get recommendations. You will then need to call the office and see if the doctor you have chosen will accept you as a new patient and what types of options they offer for patients who are self-pay. Some doctors offer discounts or will set up payment plans for their patients who do not have insurance, but you will need to ask so you aren't surprised when you get to your appointment.  2) Contact Your Local Health Department Not all health departments have doctors that can see patients for sick visits, but many do, so it is worth a call to see if yours does. If you don't know where your local health department is, you can check in your phone book. The CDC also has a tool to help you locate your state's health department, and many state websites also have listings of all of their local health departments.  3) Find a Walk-in Clinic If your illness is  not likely to be very severe or complicated, you may want to try a walk in clinic. These are popping up all over the country in pharmacies, drugstores, and shopping centers. They're usually staffed by nurse practitioners or physician assistants that have been trained to treat common illnesses and complaints. They're usually fairly quick and inexpensive. However, if you have serious medical issues or chronic medical problems, these are  probably not your best option.  No Primary Care Doctor: - Call Health Connect at  [phone] - they can help you locate a primary care doctor that  accepts your insurance, provides certain services, etc. - Physician Referral Service- [phone]  Chronic Pain Problems: Organization         Address  Phone   Notes  [facility]  [phone] Patients need to be referred by their primary care doctor.   Medication Assistance: Organization         Address  Phone   Notes  [facility] [address] [place], [place] [address] [phone] --Must be a resident of [facility] [place] -- Must have NO insurance coverage whatsoever (no Medicaid/ Medicare, etc.) -- The pt. MUST have a primary care doctor that directs their care regularly and follows them in the community   [facility]  [phone]   [employer]  [phone]    Agencies that provide inexpensive medical care: Organization         Address  Phone   Notes  [facility]  [phone]   [facility]    [phone]   [facility] [address] [place], [place] [address] [phone]   [facility] [address], [place] [phone]   [facility]    [phone]   [facility]    [phone]   [facility]  [address], [place] Phone:  [phone], Fax:  [phone] Hours of Operation:  9 am - 6 pm, M-F.  Also accepts Medicaid/Medicare and self-pay.  [facility]  [address], [place] Phone: [phone], Fax: [phone]. Hours of Operation:  8:30 am - 5:30 pm, M-F.  Also accepts Medicaid and self-pay.  [facility] [place] [address], [place] Phone: [phone]) [phone]   [facility] [address], [place], [place] [phone], Ext. [phone] Mondays & Thursdays:  7-9 AM.  First 15 patients are seen on a first come, first serve basis.    Medicaid-accepting [facility] Providers:  Organization         Address  Phone   Notes  [facility] [address], [place] [phone] Also accepts self-pay patients.  [facility] [address], [place]  [phone]   [facility] [address], [place] [phone]   [facility] [address] [place] [address], [place] [phone]   Renaye RakersVeita Bland [address], [place]   [phone] Only accepts [place] patients after they have their name applied to their card.   Self-Pay (no insurance) in [facility] [place]:  Organization         Address  Phone   Notes  [facility] Cell Patients, [facility] [address], [place] [phone]   [facility] [place] [phone]   [facility] [place]  [place], [place], [place] [phone]   [facility]/Dr. Osei-Bonsu  [address], [place] or [address], [place] [phone] Phone number for both [place] and [place] locations is the same.  [facility] [address], [place] [phone]   [facility] [address], [place] or [address] [phone]   [facility] [address] [place] [phone], phone; [phone], fax Sees patients 1st and 3rd Saturday of every month.  Must not qualify for public or private insurance (i.e. Medicaid, Medicare, [place] [employer] Choice, Veterans' Benefits)  Household income should be no more than 200% of the poverty level The clinic cannot treat you if you are pregnant or think you are pregnant  Sexually transmitted diseases are not treated at the clinic.

## 2014-02-24 NOTE — ED Notes (Signed)
Pt in u/s unable to give pain med at present.

## 2014-02-24 NOTE — ED Provider Notes (Signed)
CSN: [id number]     Arrival date & time 02/24/14  1200 History   First MD Initiated Contact with Patient 02/24/14 1212     Chief Complaint  Patient presents with  . Arm Pain     (Consider location/radiation/quality/duration/timing/severity/associated sxs/prior Treatment) Patient is a 28 y.o. female presenting with arm pain. The history is provided by the patient. No language interpreter was used.  Arm Pain This is a new problem. The current episode started in the past 7 days. The problem occurs constantly. Associated symptoms include numbness. Pertinent negatives include no chest pain, fever, rash or weakness. Associated symptoms comments: Right forearm pain and swelling for 3 days without known injury. No fever. She reports that her hand feels numb but there is no weakness. No upper arm pain. .    Past Medical History  Diagnosis Date  . Heart palpitations   . Preeclampsia   . Irregular menstrual bleeding    Past Surgical History  Procedure Laterality Date  . Cesarean section    . Endometrial ablation    . Tubal ligation    . Soft tissue cyst excision    . Wisdom tooth extraction     No family history on file. History  Substance Use Topics  . Smoking status: Never Smoker   . Smokeless tobacco: Not on file  . Alcohol Use: Yes     Comment: socially   OB History   Grav Para Term Preterm Abortions TAB SAB Ect Mult Living                 Review of Systems  Constitutional: Negative for fever.  Respiratory: Negative for shortness of breath.   Cardiovascular: Negative for chest pain and leg swelling.  Musculoskeletal:       See HPI.  Skin: Negative for color change and rash.  Neurological: Positive for numbness. Negative for weakness.      Allergies  Ancef  Home Medications   Prior to Admission medications   Medication Sig Start Date End Date Taking? Authorizing Provider  albuterol (PROVENTIL HFA;VENTOLIN HFA) 108 (90 BASE) MCG/ACT inhaler Inhale 2 puffs into the  lungs every 2 (two) hours as needed for wheezing or shortness of breath (cough). 05/02/13   Hurman Horn, MD  HYDROcodone-acetaminophen (NORCO) 5-325 MG per tablet Take 2 tablets by mouth every 4 (four) hours as needed. 05/02/13   Hurman Horn, MD  Nitrofurantoin Monohyd Macro (MACROBID PO) Take by mouth.    Historical Provider, MD  ondansetron (ZOFRAN ODT) 8 MG disintegrating tablet Take 1 tablet (8 mg total) by mouth every 8 (eight) hours as needed for nausea or vomiting. 09/15/13   Hilario Quarry, MD   BP 108/69  Pulse 79  Temp(Src) 98.3 F (36.8 C) (Oral)  Resp 18  Ht 5' (1.524 m)  Wt 140 lb (63.504 kg)  BMI 27.34 kg/m2  SpO2 100%  LMP 02/11/2014 Physical Exam  Constitutional: She is oriented to person, place, and time. She appears well-developed and well-nourished.  Neck: Normal range of motion.  Cardiovascular: Intact distal pulses.   Pulmonary/Chest: Effort normal.  Musculoskeletal:  Right forearm tenderness to mid-shaft volar aspect. There is a focal area of swelling that is not fluctuant. No warmth to tough. FROM all joints of upper extremity.   Neurological: She is alert and oriented to person, place, and time.  Skin: Skin is warm and dry.    ED Course  Procedures (including critical care time) Labs Review Labs Reviewed - No data  to display  Imaging Review No results found.   EKG Interpretation None     Results for orders placed during the hospital encounter of 09/15/13  URINALYSIS, ROUTINE W REFLEX MICROSCOPIC      Result Value Ref Range   Color, Urine YELLOW  YELLOW   APPearance CLOUDY (*) CLEAR   Specific Gravity, Urine 1.017  1.005 - 1.030   pH 8.0  5.0 - 8.0   Glucose, UA NEGATIVE  NEGATIVE mg/dL   Hgb urine dipstick NEGATIVE  NEGATIVE   Bilirubin Urine NEGATIVE  NEGATIVE   Ketones, ur NEGATIVE  NEGATIVE mg/dL   Protein, ur NEGATIVE  NEGATIVE mg/dL   Urobilinogen, UA 0.2  0.0 - 1.0 mg/dL   Nitrite NEGATIVE  NEGATIVE   Leukocytes, UA NEGATIVE   NEGATIVE  PREGNANCY, URINE      Result Value Ref Range   Preg Test, Ur NEGATIVE  NEGATIVE  CBC WITH DIFFERENTIAL      Result Value Ref Range   WBC 3.9 (*) 4.0 - 10.5 K/uL   RBC 4.35  3.87 - 5.11 MIL/uL   Hemoglobin 12.8  12.0 - 15.0 g/dL   HCT 40.938.9  81.136.0 - 91.446.0 %   MCV 89.4  78.0 - 100.0 fL   MCH 29.4  26.0 - 34.0 pg   MCHC 32.9  30.0 - 36.0 g/dL   RDW 78.213.4  95.611.5 - 21.315.5 %   Platelets 149 (*) 150 - 400 K/uL   Neutrophils Relative % 36 (*) 43 - 77 %   Neutro Abs 1.4 (*) 1.7 - 7.7 K/uL   Lymphocytes Relative 54 (*) 12 - 46 %   Lymphs Abs 2.1  0.7 - 4.0 K/uL   Monocytes Relative 10  3 - 12 %   Monocytes Absolute 0.4  0.1 - 1.0 K/uL   Eosinophils Relative 1  0 - 5 %   Eosinophils Absolute 0.0  0.0 - 0.7 K/uL   Basophils Relative 0  0 - 1 %   Basophils Absolute 0.0  0.0 - 0.1 K/uL  COMPREHENSIVE METABOLIC PANEL      Result Value Ref Range   Sodium 143  137 - 147 mEq/L   Potassium 4.0  3.7 - 5.3 mEq/L   Chloride 104  96 - 112 mEq/L   CO2 28  19 - 32 mEq/L   Glucose, Bld 98  70 - 99 mg/dL   BUN 10  6 - 23 mg/dL   Creatinine, Ser 0.860.70  0.50 - 1.10 mg/dL   Calcium 9.7  8.4 - 57.810.5 mg/dL   Total Protein 7.0  6.0 - 8.3 g/dL   Albumin 4.2  3.5 - 5.2 g/dL   AST 15  0 - 37 U/L   ALT 9  0 - 35 U/L   Alkaline Phosphatase 32 (*) 39 - 117 U/L   Total Bilirubin 0.3  0.3 - 1.2 mg/dL   GFR calc non Af Amer >90  >90 mL/min   GFR calc Af Amer >90  >90 mL/min  LIPASE, BLOOD      Result Value Ref Range   Lipase 15  11 - 59 U/L    MDM   Final diagnoses:  Right arm pain    1. Right arm pain  Negative doppler study for DVT in right UE. Likely soft tissue, possibly contusion injury. Will treat with anti-inflammatories, warm compresses, PCP follow up.    Arnoldo HookerShari A Vahan Wadsworth, PA-C 02/24/14 1443

## 2014-02-24 NOTE — ED Notes (Signed)
C/o right arm pain, pain is worse in inner forearm area. Onset Sunday. Slight swelling noted to inner arm. State she took tylenol arthritis 1 hour ago.

## 2014-02-25 NOTE — ED Provider Notes (Signed)
Medical screening examination/treatment/procedure(s) were performed by non-physician practitioner and as supervising physician I was immediately available for consultation/collaboration.    Linwood DibblesJon Lekisha Mcghee, MD 02/25/14 [phone]

## 2014-04-21 IMAGING — US US PELVIS COMPLETE
1 series · 13 of 25 positions shown · non-contrast
Comparison: None.

CLINICAL DATA: Left lower quadrant pain. Tested positive for STD
today. LMP 09/14/2012.

TRANSABDOMINAL AND TRANSVAGINAL ULTRASOUND OF PELVIS
DOPPLER ULTRASOUND OF OVARIES
TECHNIQUE: Both transabdominal and transvaginal ultrasound
examinations of the pelvis were performed. Transabdominal technique
was performed for global imaging of the pelvis including uterus,
ovaries, adnexal regions, and pelvic cul-de-sac.
It was necessary to proceed with endovaginal exam following the
transabdominal exam to visualize the myometrium, endometrium and
adnexa.
Color and duplex Doppler ultrasound was utilized to evaluate blood
flow to the ovaries.

[Series 1: us pelvis complete · 0.22mm/px · 13 of 68 slices shown]
[im 1/68]
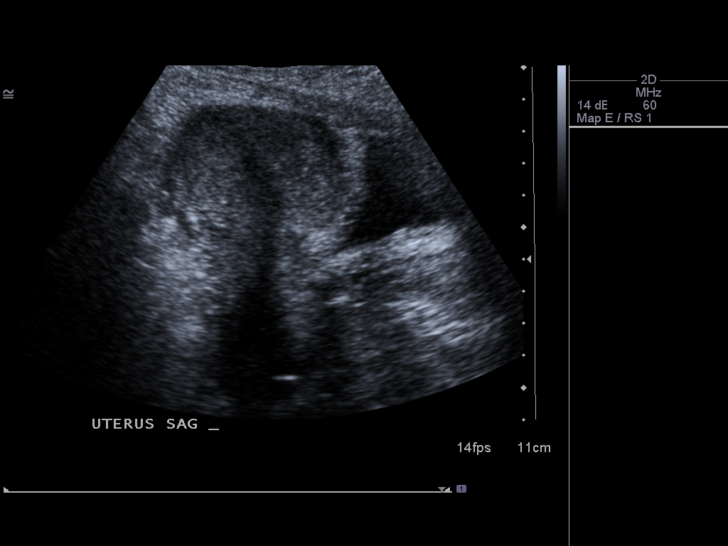
[im 6/68]
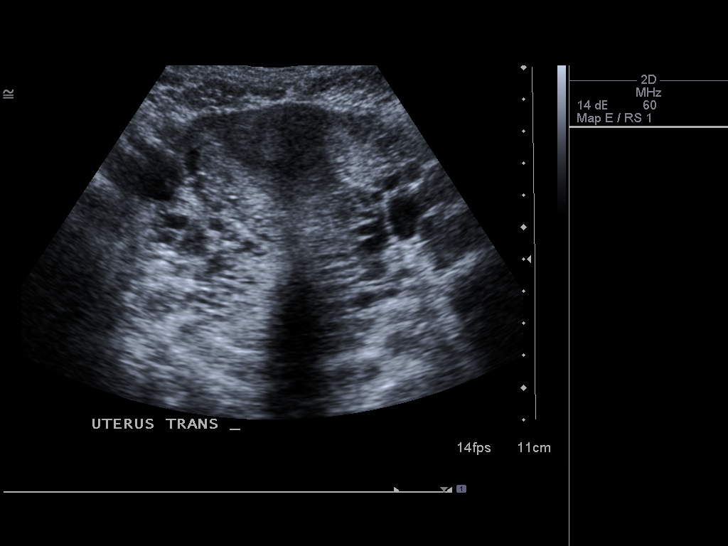
[im 12/68]
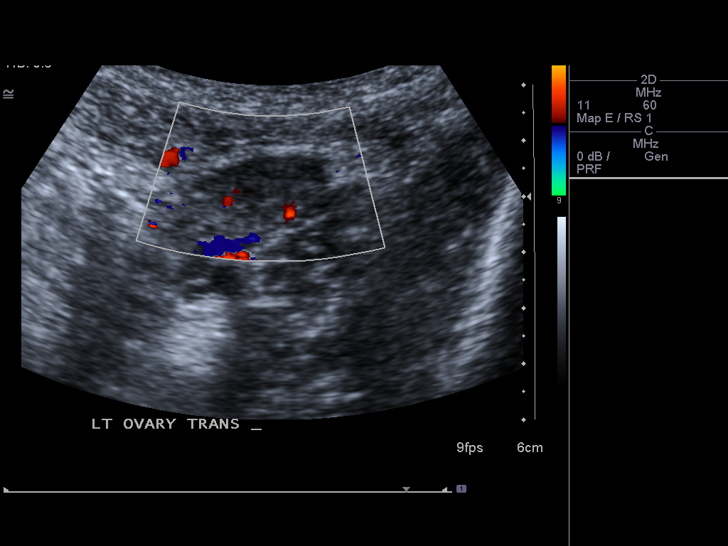
[im 17/68]
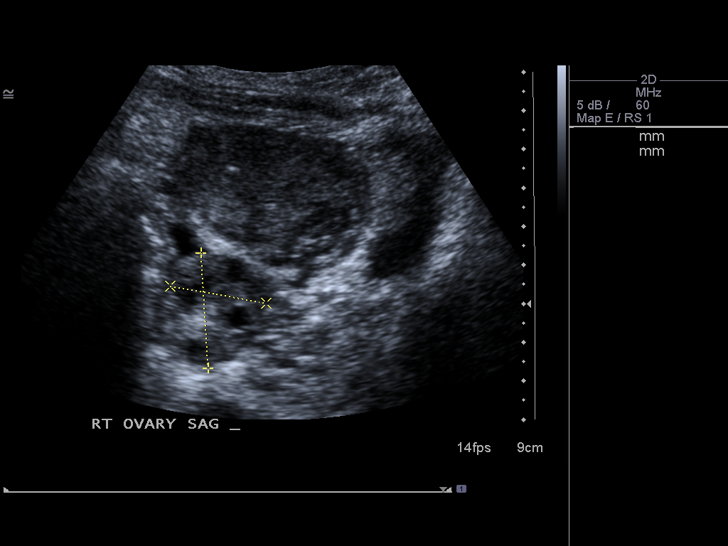
[im 23/68]
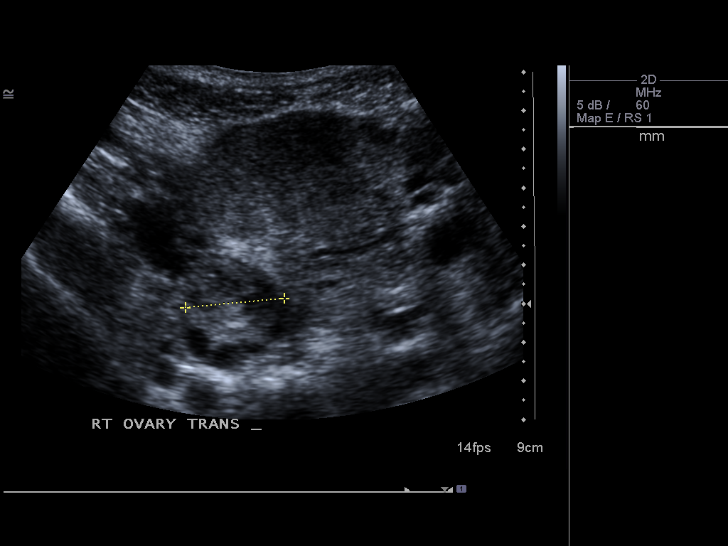
[im 28/68]
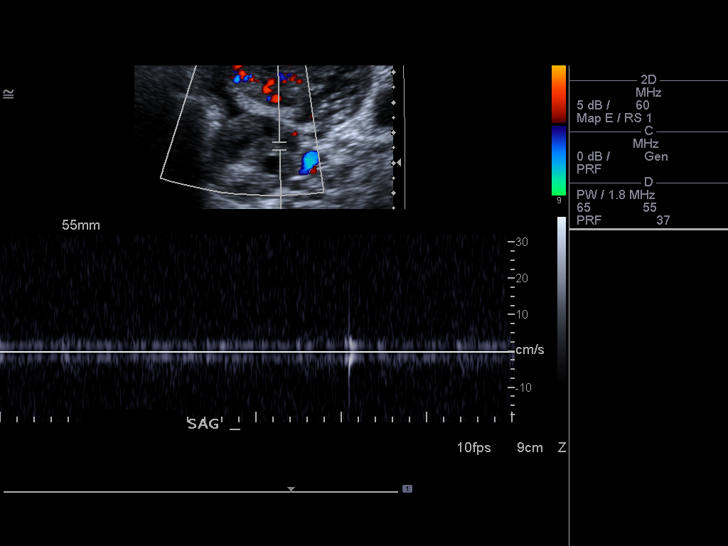
[im 34/68]
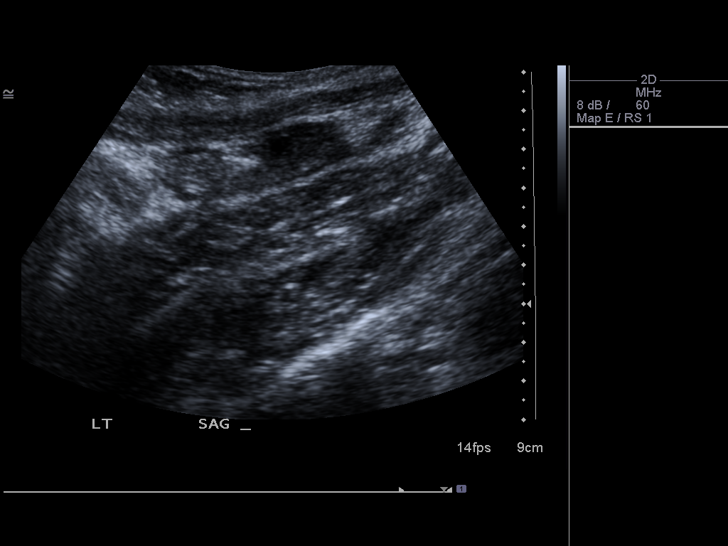
[im 40/68]
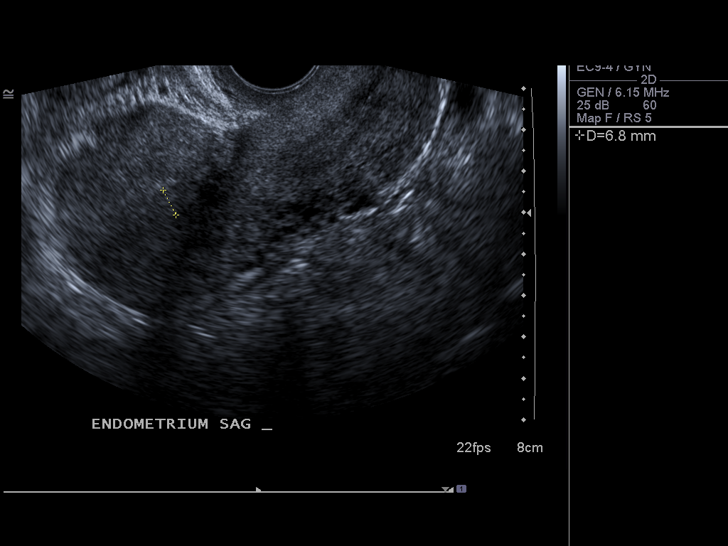
[im 45/68]
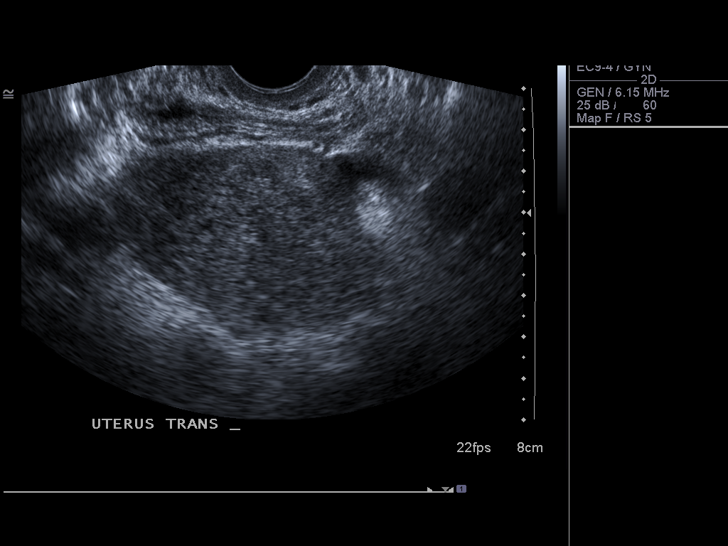
[im 51/68]
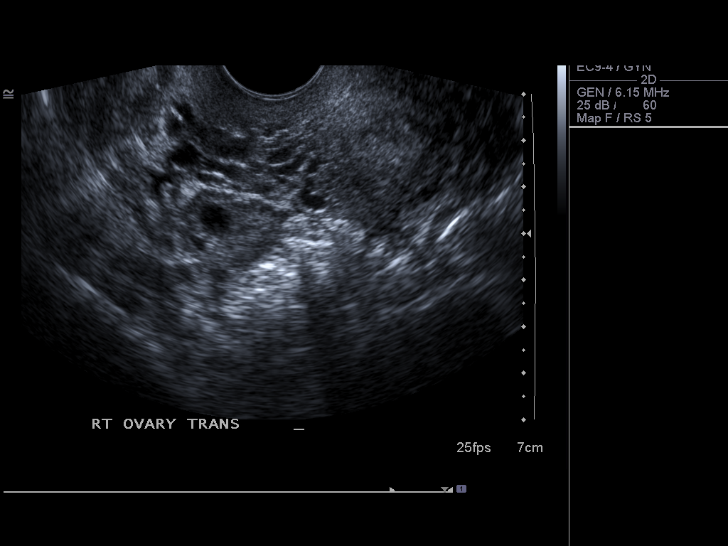
[im 56/68]
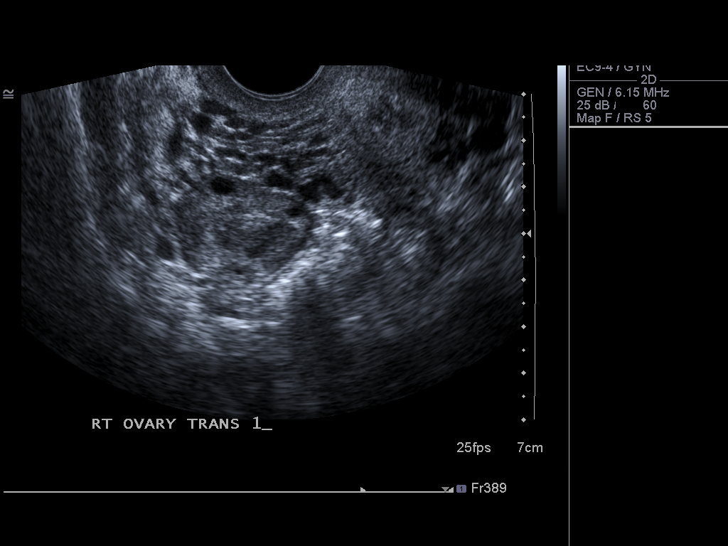
[im 62/68]
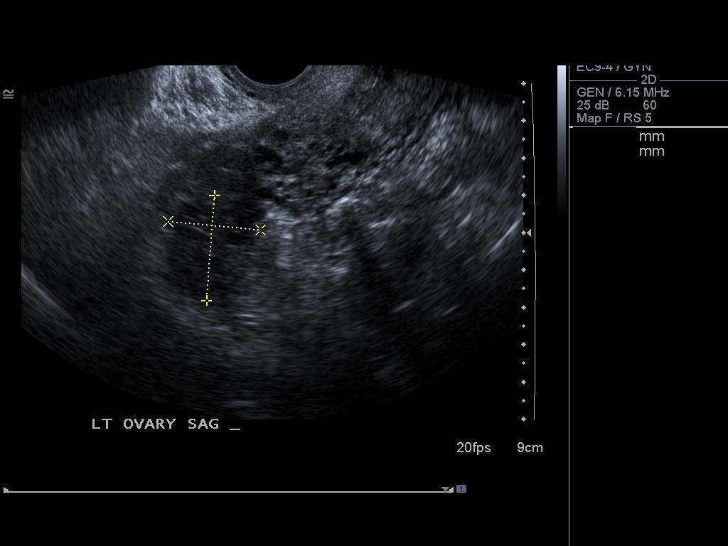
[im 68/68]
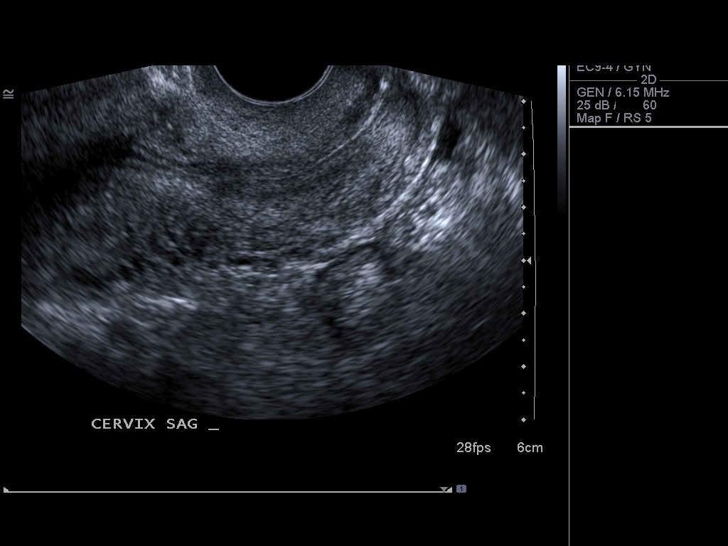

[13 of 25 positions shown; findings below may reference images not displayed]

FINDINGS: Uterus:  Is anteverted and anteflexed and demonstrates a sagittal
length of 9.8 cm, depth of 5.0 cm and width of 6.3 cm.  A
homogeneous myometrium is seen

Endometrium:  Appears thin with a width of 5.6 mm.  No areas of
thickening or heterogeneity are seen

Right ovary: Measures 3.5 x 1.9 x 2.8 cm and has a normal
appearance with a corpus luteum.  Flow is identified within the
ovary with color Doppler evaluation.

Left ovary:   Measures 2.8 x 2.5 x 2.7 cm and has a normal
appearance.  Flow is seen within the ovarian stroma with color
Doppler assessment

Pulsed Doppler evaluation demonstrates normal low-resistance
arterial and venous waveforms in both ovaries.

Other: No pelvic fluid or separate adnexal masses are seen
IMPRESSION: Unremarkable pelvic ultrasound with no sonographic findings
suspicious for pelvic inflammatory disease or ovarian torsion seen

## 2014-08-19 ENCOUNTER — Telehealth: Payer: Self-pay | Admitting: Internal Medicine

## 2014-08-19 NOTE — Telephone Encounter (Signed)
Called pt and scheduled appt with Dr. Arbutus PedMohamed.  Mohamed 09/27/14 11 am  Dx: Hx of Low platelet count Referring: Dr. Dion BodyVarnado

## 2014-09-24 ENCOUNTER — Other Ambulatory Visit: Payer: Self-pay | Admitting: Medical Oncology

## 2014-09-24 DIAGNOSIS — D696 Thrombocytopenia, unspecified: Secondary | ICD-10-CM

## 2014-09-27 ENCOUNTER — Telehealth: Payer: Self-pay | Admitting: Internal Medicine

## 2014-09-27 ENCOUNTER — Other Ambulatory Visit (HOSPITAL_BASED_OUTPATIENT_CLINIC_OR_DEPARTMENT_OTHER): Payer: BLUE CROSS/BLUE SHIELD

## 2014-09-27 ENCOUNTER — Encounter: Payer: Self-pay | Admitting: Internal Medicine

## 2014-09-27 ENCOUNTER — Ambulatory Visit (HOSPITAL_BASED_OUTPATIENT_CLINIC_OR_DEPARTMENT_OTHER): Payer: BLUE CROSS/BLUE SHIELD | Admitting: Internal Medicine

## 2014-09-27 ENCOUNTER — Ambulatory Visit: Payer: BLUE CROSS/BLUE SHIELD

## 2014-09-27 ENCOUNTER — Other Ambulatory Visit: Payer: Self-pay | Admitting: Internal Medicine

## 2014-09-27 ENCOUNTER — Encounter (INDEPENDENT_AMBULATORY_CARE_PROVIDER_SITE_OTHER): Payer: Self-pay

## 2014-09-27 VITALS — BP 113/76 | HR 74 | Temp 98.4°F | Resp 18 | Ht 60.0 in | Wt 141.5 lb

## 2014-09-27 DIAGNOSIS — D72819 Decreased white blood cell count, unspecified: Secondary | ICD-10-CM | POA: Insufficient documentation

## 2014-09-27 DIAGNOSIS — D709 Neutropenia, unspecified: Secondary | ICD-10-CM | POA: Diagnosis not present

## 2014-09-27 DIAGNOSIS — D696 Thrombocytopenia, unspecified: Secondary | ICD-10-CM

## 2014-09-27 LAB — CBC WITH DIFFERENTIAL/PLATELET
BASO%: 0.3 % (ref 0.0–2.0)
BASOS ABS: 0 10*3/uL (ref 0.0–0.1)
EOS ABS: 0.1 10*3/uL (ref 0.0–0.5)
EOS%: 1.7 % (ref 0.0–7.0)
HCT: 38.3 % (ref 34.8–46.6)
HGB: 12.4 g/dL (ref 11.6–15.9)
LYMPH#: 1.6 10*3/uL (ref 0.9–3.3)
LYMPH%: 54.6 % — AB (ref 14.0–49.7)
MCH: 28.7 pg (ref 25.1–34.0)
MCHC: 32.4 g/dL (ref 31.5–36.0)
MCV: 88.7 fL (ref 79.5–101.0)
MONO#: 0.3 10*3/uL (ref 0.1–0.9)
MONO%: 11 % (ref 0.0–14.0)
NEUT#: 0.9 10*3/uL — ABNORMAL LOW (ref 1.5–6.5)
NEUT%: 32.4 % — AB (ref 38.4–76.8)
Platelets: 135 10*3/uL — ABNORMAL LOW (ref 145–400)
RBC: 4.32 10*6/uL (ref 3.70–5.45)
RDW: 14.1 % (ref 11.2–14.5)
WBC: 2.9 10*3/uL — AB (ref 3.9–10.3)

## 2014-09-27 LAB — COMPREHENSIVE METABOLIC PANEL (CC13)
ALT: 13 U/L (ref 0–55)
ANION GAP: 8 meq/L (ref 3–11)
AST: 15 U/L (ref 5–34)
Albumin: 3.7 g/dL (ref 3.5–5.0)
Alkaline Phosphatase: 31 U/L — ABNORMAL LOW (ref 40–150)
BILIRUBIN TOTAL: 0.21 mg/dL (ref 0.20–1.20)
BUN: 10.8 mg/dL (ref 7.0–26.0)
CALCIUM: 8.7 mg/dL (ref 8.4–10.4)
CO2: 25 meq/L (ref 22–29)
CREATININE: 0.8 mg/dL (ref 0.6–1.1)
Chloride: 108 mEq/L (ref 98–109)
EGFR: >90 mL/min/{1.73_m2} (ref >90)
GLUCOSE: 93 mg/dL (ref 70–140)
Potassium: 4.3 mEq/L (ref 3.5–5.1)
Sodium: 141 mEq/L (ref 136–145)
Total Protein: 6.1 g/dL — ABNORMAL LOW (ref 6.4–8.3)

## 2014-09-27 NOTE — Progress Notes (Signed)
[place] Telephone:([phone]   Fax:(336) [phone]  CONSULT NOTE  REFERRING PHYSICIAN: Dr. Thurnell Lose  REASON FOR CONSULTATION:  29 years old African-American female with low platelets  HPI Sabrina Owens is a 29 y.o. female with no significant past medical history except for history of preeclampsia during pregnancy as well as history of C-section and cryoendometrial ablation. The patient was seen recently by her primary care physician and was noticed to have persistent thrombocytopenia. She mentioned that the ago she was at the emergency department and she was also told that she has low platelets count. She was referred to me today for evaluation and discussion of her condition. The patient denied having any significant bleeding issues except for irregular menstruation which sometimes can be twice a month. She had cryoendometrial ablation in the past. She denied having any epistaxis, gum bleed, hemoptysis, hematemesis, Owens or hematochezia. She denied having any significant bruises or ecchymosis. The patient used to take ibuprofen as needed for arthritis and headache. She is currently on any significant medications except for Prozac.  When seen today she complains only of feeling tired and having some aching pain as well as occasional swelling of her lower extremities. Family history significant for mother with hypertension but no bleeding issues. She doesn't know much about her father's medical history. There is no history of malignancy or bleeding issues in her family. The patient is single and has 3 children. She works at [employer]. She has no history of smoking but drinks alcohol occasionally and she also used marijuana regularly.   HPI  Past Medical History  Diagnosis Date  . Heart palpitations   . Preeclampsia   . Irregular menstrual bleeding     Past Surgical History  Procedure Laterality Date  . Cesarean section    . Endometrial ablation     . Tubal ligation    . Soft tissue cyst excision    . Wisdom tooth extraction      History reviewed. No pertinent family history.  Social History History  Substance Use Topics  . Smoking status: Never Smoker   . Smokeless tobacco: Not on file  . Alcohol Use: 1.8 oz/week    1 Shots of liquor, 1 Cans of beer, 1 Glasses of wine per week     Comment: socially    Allergies  Allergen Reactions  . Ancef [Cefazolin] Hives    Current Outpatient Prescriptions  Medication Sig Dispense Refill  . albuterol (PROVENTIL HFA;VENTOLIN HFA) 108 (90 BASE) MCG/ACT inhaler Inhale 2 puffs into the lungs every 2 (two) hours as needed for wheezing or shortness of breath (cough). 1 Inhaler 0  . FLUoxetine (PROZAC) 20 MG capsule   5  . HYDROcodone-acetaminophen (NORCO) 5-325 MG per tablet Take 2 tablets by mouth every 4 (four) hours as needed. 10 tablet 0  . ibuprofen (ADVIL,MOTRIN) 800 MG tablet Take 1 tablet (800 mg total) by mouth 3 (three) times daily. 21 tablet 0  . metroNIDAZOLE (FLAGYL) 500 MG tablet   0  . NATAZIA 3/2-2/2-3/1 MG tablet   0  . Nitrofurantoin Monohyd Macro (MACROBID PO) Take by mouth.    . ondansetron (ZOFRAN ODT) 8 MG disintegrating tablet Take 1 tablet (8 mg total) by mouth every 8 (eight) hours as needed for nausea or vomiting. 20 tablet 0   No current facility-administered medications for this visit.    Review of Systems  Constitutional: positive for fatigue Eyes: negative Ears, nose, mouth, throat, and face: negative  Respiratory: negative Cardiovascular: negative Gastrointestinal: negative Genitourinary:negative Integument/breast: negative Hematologic/lymphatic: negative Musculoskeletal:negative Neurological: negative Behavioral/Psych: negative Endocrine: negative Allergic/Immunologic: negative  Physical Exam  DQQ:IWLNL, healthy, no distress, well nourished and well developed SKIN: skin color, texture, turgor are normal, no rashes or significant  lesions HEAD: Normocephalic, No masses, lesions, tenderness or abnormalities EYES: normal, PERRLA EARS: External ears normal, Canals clear OROPHARYNX:no exudate, no erythema and lips, buccal mucosa, and tongue normal  NECK: supple, no adenopathy, no JVD LYMPH:  no palpable lymphadenopathy, no hepatosplenomegaly BREAST:not examined LUNGS: clear to auscultation , and palpation HEART: regular rate & rhythm and no murmurs ABDOMEN:abdomen soft, non-tender, normal bowel sounds and no masses or organomegaly BACK: Back symmetric, no curvature., No CVA tenderness EXTREMITIES:no joint deformities, effusion, or inflammation, no edema, no skin discoloration  NEURO: alert & oriented x 3 with fluent speech, no focal motor/sensory deficits  PERFORMANCE STATUS: ECOG 0  LABORATORY DATA: Lab Results  Component Value Date   WBC 2.9* 09/27/2014   HGB 12.4 09/27/2014   HCT 38.3 09/27/2014   MCV 88.7 09/27/2014   PLT 135* 09/27/2014      Chemistry      Component Value Date/Time   NA 143 09/15/2013 1212   K 4.0 09/15/2013 1212   CL 104 09/15/2013 1212   CO2 28 09/15/2013 1212   BUN 10 09/15/2013 1212   CREATININE 0.70 09/15/2013 1212      Component Value Date/Time   CALCIUM 9.7 09/15/2013 1212   ALKPHOS 32* 09/15/2013 1212   AST 15 09/15/2013 1212   ALT 9 09/15/2013 1212   BILITOT 0.3 09/15/2013 1212       RADIOGRAPHIC STUDIES: No results found.  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 The patient was also noted on her recent bloodwork to have persistent leukocytopenia and neutropenia. This is actually more concerning than her mild thrombocytopenia. It could be S take in origin but I cannot rule out any other bone marrow abnormality at this  point.  PLAN: I had a lengthy discussion with the patient and her mother about her condition. I will do several studies for evaluation of her leukocytopenia and neutropenia including repeat CBC, comprehensive metabolic panel, LDH as well as rheumatoid factor, ANA and hepatitis panel. If no clear etiology for her persistent leukocytopenia and neutropenia, I would consider the patient for a bone marrow biopsy and aspirate. Regarding the mild thrombocytopenia, this will be monitored by observation. No active treatment is needed unless the patient has significant bleeding or platelets count less than 30,000. She will come back for follow-up visit in 6 weeks for reevaluation and repeat CBC and LDH. The patient was advised to call immediately if she has any concerning symptoms in the interval.  The patient voices understanding of current disease status and treatment options and is in agreement with the current care plan.  All questions were answered. The patient knows to call the clinic with any problems, questions or concerns. We can certainly see the patient much sooner if necessary.  Thank you so much for allowing me to participate in the care of Sabrina Owens. I will continue to follow up the patient with you and assist in her care.  I spent 35 minutes counseling the patient face to face. The total time spent in the appointment  was 55 minutes.  Disclaimer: This note was dictated with voice recognition software. Similar sounding words can inadvertently be transcribed and may not be corrected upon review.   Melora Menon K. Sep 27, 2014, 12:43 PM

## 2014-09-27 NOTE — Telephone Encounter (Signed)
per pof to sch pt appt-gave pt copy of sch-pt back to lab- °

## 2014-09-27 NOTE — Progress Notes (Signed)
Checked in new pt with no financial concerns. °

## 2014-09-30 LAB — HEPATITIS PANEL, ACUTE
HCV AB: REACTIVE — AB
HEP B C IGM: NONREACTIVE
HEP B S AG: NEGATIVE
Hep A IgM: NONREACTIVE

## 2014-09-30 LAB — ANA: Anti Nuclear Antibody(ANA): NEGATIVE

## 2014-09-30 LAB — RHEUMATOID FACTOR

## 2014-09-30 LAB — HEPATITIS C RNA QUANTITATIVE: HCV Quantitative: NOT DETECTED IU/mL (ref <15)

## 2014-10-01 ENCOUNTER — Telehealth: Payer: Self-pay | Admitting: *Deleted

## 2014-10-01 NOTE — Telephone Encounter (Addendum)
Called requesting "lab results because the next F/U isn't until November 08, 2014.  He said he'll have to go in my bone if tests is negative.  Return number [phone].  Will notify provider of this request.  Informed her results appear stable or negative.  Expressed anxiety about possible bone marrow biopsy.  Informed her of bone marrow process and walking restrictions after procedure if Bx needed.  Asked if she'll be awake during and yes, awake is normally how procedure is done.  Encouraged to sign up for MyChart patient portal.

## 2014-10-02 NOTE — Telephone Encounter (Signed)
Will discuss results in more details and need for Bone marrow or not next visit.

## 2014-10-05 NOTE — Telephone Encounter (Signed)
Called Ms. Sabrina Owens with this information. "I iwould like an earlier appointment to receive results.  I suffer depression and mood disorders I can't control.  Having to wait is something I can't handle.  I need a doctor who will communicate better.  Do I need to take ant precautions, avoid things, people.  What is wrong with me and what do I need to do?"  Advised that no precautions needed and will ask for appointment change.

## 2014-10-06 NOTE — Telephone Encounter (Signed)
Ok to see her earlier if I have availability.

## 2014-10-07 ENCOUNTER — Telehealth: Payer: Self-pay | Admitting: Internal Medicine

## 2014-10-07 NOTE — Telephone Encounter (Signed)
s.w. pt and gv earlier appt....pt ok and aware

## 2014-10-07 NOTE — Telephone Encounter (Signed)
P.O.F. generated

## 2014-10-11 ENCOUNTER — Other Ambulatory Visit (HOSPITAL_BASED_OUTPATIENT_CLINIC_OR_DEPARTMENT_OTHER): Payer: BLUE CROSS/BLUE SHIELD

## 2014-10-11 ENCOUNTER — Ambulatory Visit (HOSPITAL_BASED_OUTPATIENT_CLINIC_OR_DEPARTMENT_OTHER): Payer: BLUE CROSS/BLUE SHIELD | Admitting: Internal Medicine

## 2014-10-11 ENCOUNTER — Encounter: Payer: Self-pay | Admitting: Internal Medicine

## 2014-10-11 VITALS — BP 110/78 | HR 74 | Temp 98.0°F | Resp 17 | Ht 60.0 in | Wt 141.6 lb

## 2014-10-11 DIAGNOSIS — D72819 Decreased white blood cell count, unspecified: Secondary | ICD-10-CM | POA: Diagnosis not present

## 2014-10-11 DIAGNOSIS — K752 Nonspecific reactive hepatitis: Secondary | ICD-10-CM | POA: Diagnosis not present

## 2014-10-11 DIAGNOSIS — D696 Thrombocytopenia, unspecified: Secondary | ICD-10-CM

## 2014-10-11 DIAGNOSIS — R768 Other specified abnormal immunological findings in serum: Secondary | ICD-10-CM | POA: Insufficient documentation

## 2014-10-11 LAB — CBC WITH DIFFERENTIAL/PLATELET
BASO%: 0.5 % (ref 0.0–2.0)
BASOS ABS: 0 10*3/uL (ref 0.0–0.1)
EOS ABS: 0 10*3/uL (ref 0.0–0.5)
EOS%: 1 % (ref 0.0–7.0)
HEMATOCRIT: 38.7 % (ref 34.8–46.6)
HGB: 12.4 g/dL (ref 11.6–15.9)
LYMPH#: 1.7 10*3/uL (ref 0.9–3.3)
LYMPH%: 44 % (ref 14.0–49.7)
MCH: 28.4 pg (ref 25.1–34.0)
MCHC: 32.1 g/dL (ref 31.5–36.0)
MCV: 88.7 fL (ref 79.5–101.0)
MONO#: 0.3 10*3/uL (ref 0.1–0.9)
MONO%: 8.4 % (ref 0.0–14.0)
NEUT%: 46.1 % (ref 38.4–76.8)
NEUTROS ABS: 1.8 10*3/uL (ref 1.5–6.5)
PLATELETS: 141 10*3/uL — AB (ref 145–400)
RBC: 4.36 10*6/uL (ref 3.70–5.45)
RDW: 14.2 % (ref 11.2–14.5)
WBC: 3.8 10*3/uL — ABNORMAL LOW (ref 3.9–10.3)

## 2014-10-11 LAB — LACTATE DEHYDROGENASE (CC13): LDH: 162 U/L (ref 125–245)

## 2014-10-11 NOTE — Progress Notes (Signed)
[facility] Telephone:(336) [phone]   Fax:(336) [phone]  OFFICE PROGRESS NOTE  Sabrina RankinsVARNADO, EVELYN, MD [address] [place] [id number]  DIAGNOSIS:  Mild leukocytopenia and thrombocytopenia.  PRIOR THERAPY: None  CURRENT THERAPY: Observation  INTERVAL HISTORY: Sabrina Owens 29 y.o. female returns to the clinic today for follow-up visit. The patient is feeling fine today with no specific complaints. She denied any significant weakness or fatigue. She denied having any nausea or vomiting. The patient has no significant chest pain, shortness of breath, cough or hemoptysis. She had several studies performed recently for evaluation of her leukocytopenia and thrombocytopenia including rheumatoid factor and ANA that were reported to be normal. Hepatitis panel showed reactive hepatitis C antibody.  The patient has repeat CBC earlier today and she is here for evaluation and discussion of her lab results.  MEDICAL HISTORY: Past Medical History  Diagnosis Date  . Heart palpitations   . Preeclampsia   . Irregular menstrual bleeding     ALLERGIES:  is allergic to ancef.  MEDICATIONS:  Current Outpatient Prescriptions  Medication Sig Dispense Refill  . albuterol (PROVENTIL HFA;VENTOLIN HFA) 108 (90 BASE) MCG/ACT inhaler Inhale 2 puffs into the lungs every 2 (two) hours as needed for wheezing or shortness of breath (cough). 1 Inhaler 0  . aspirin-acetaminophen-caffeine (EXCEDRIN MIGRAINE) 250-250-65 MG per tablet Take 2 tablets by mouth daily.    Marland Kitchen. FLUoxetine (PROZAC) 20 MG capsule   5  . NATAZIA 3/2-2/2-3/1 MG tablet   0   No current facility-administered medications for this visit.    SURGICAL HISTORY:  Past Surgical History  Procedure Laterality Date  . Cesarean section    . Endometrial ablation    . Tubal ligation    . Soft tissue cyst excision    . Wisdom tooth extraction      REVIEW OF SYSTEMS:  A comprehensive review of systems was  negative.   PHYSICAL EXAMINATION: General appearance: alert, cooperative and no distress Head: Normocephalic, without obvious abnormality, atraumatic Neck: no adenopathy, no JVD, supple, symmetrical, trachea midline and thyroid not enlarged, symmetric, no tenderness/mass/nodules Lymph nodes: Cervical, supraclavicular, and axillary nodes normal. Resp: clear to auscultation bilaterally Back: symmetric, no curvature. ROM normal. No CVA tenderness. Cardio: regular rate and rhythm, S1, S2 normal, no murmur, click, rub or gallop GI: soft, non-tender; bowel sounds normal; no masses,  no organomegaly Extremities: extremities normal, atraumatic, no cyanosis or edema  ECOG PERFORMANCE STATUS: 0 - Asymptomatic  Blood pressure 110/78, pulse 74, temperature 98 F (36.7 C), temperature source Oral, resp. rate 17, height 5' (1.524 m), weight 141 lb 9.6 oz (64.229 kg), SpO2 100 %.  LABORATORY DATA: Lab Results  Component Value Date   WBC 3.8* 10/11/2014   HGB 12.4 10/11/2014   HCT 38.7 10/11/2014   MCV 88.7 10/11/2014   PLT 141* 10/11/2014      Chemistry      Component Value Date/Time   NA 141 09/27/2014 1154   NA 143 09/15/2013 1212   K 4.3 09/27/2014 1154   K 4.0 09/15/2013 1212   CL 104 09/15/2013 1212   CO2 25 09/27/2014 1154   CO2 28 09/15/2013 1212   BUN 10.8 09/27/2014 1154   BUN 10 09/15/2013 1212   CREATININE 0.8 09/27/2014 1154   CREATININE 0.70 09/15/2013 1212      Component Value Date/Time   CALCIUM 8.7 09/27/2014 1154   CALCIUM 9.7 09/15/2013 1212   ALKPHOS 31* 09/27/2014 1154  ALKPHOS 32* 09/15/2013 1212   AST 15 09/27/2014 1154   AST 15 09/15/2013 1212   ALT 13 09/27/2014 1154   ALT 9 09/15/2013 1212   BILITOT 0.21 09/27/2014 1154   BILITOT 0.3 09/15/2013 1212       RADIOGRAPHIC STUDIES: No results found.  ASSESSMENT AND PLAN:  This is a very pleasant 29 years old African-American female with mild thrombocytopenia and leukocytopenia.  Her absolute  neutrophil count has significantly improved since her last visit. Her previousextensive blood work showed no significant abnormality except for the reactive hepatitis C antibody.  I discussed the lab result with the patient today. I recommended for her to continue on observation with routine follow-up visit with her primary care physician.  For the hepatitis C reactive test , I recommended for the patient to discuss with her primary care physician further evaluation or referral to a hepatologist.  I don't see a need to see the patient at regular basis at this point but I'll be happy to see her in the future if needed.  She was advised to call if she has any concerning symptoms. The patient voices understanding of current disease status and treatment options and is in agreement with the current care plan.  All questions were answered. The patient knows to call the clinic with any problems, questions or concerns. We can certainly see the patient much sooner if necessary.  Disclaimer: This note was dictated with voice recognition software. Similar sounding words can inadvertently be transcribed and may not be corrected upon review.

## 2014-11-08 ENCOUNTER — Ambulatory Visit: Payer: BLUE CROSS/BLUE SHIELD | Admitting: Internal Medicine

## 2014-11-08 ENCOUNTER — Other Ambulatory Visit: Payer: BLUE CROSS/BLUE SHIELD

## 2015-04-18 IMAGING — CT CT ABD-PELV W/ CM
2 of 4 series · 17 of 46 positions shown, 19 images · IV contrast (APPLIED)
Comparison: None.

CLINICAL DATA: abdominal pain

EXAM:
CT ABDOMEN AND PELVIS WITH CONTRAST
TECHNIQUE: Multidetector CT imaging of the abdomen and pelvis was performed
using the standard protocol following bolus administration of
intravenous contrast.
CONTRAST:  50mL OMNIPAQUE IOHEXOL 300 MG/ML SOLN, 100mL OMNIPAQUE
IOHEXOL 300 MG/ML SOLN

[Series 2: abd/pelvis 5.0 b31f · axial · 0.64mm/px · z∈[+826,+1196]mm · 14 of 82 slices shown, 16 images]
[im 4/82  soft-tissue]
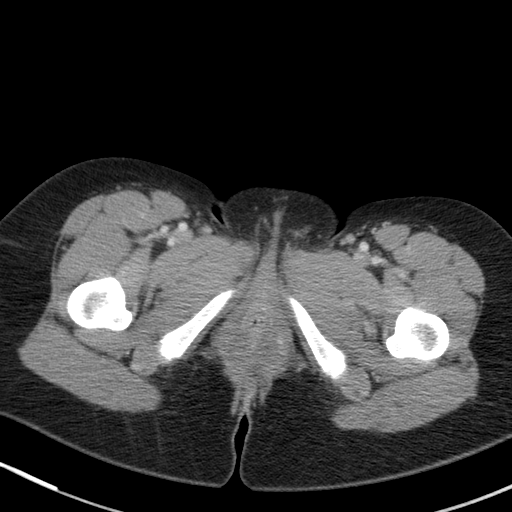
[im 4/82  bone]
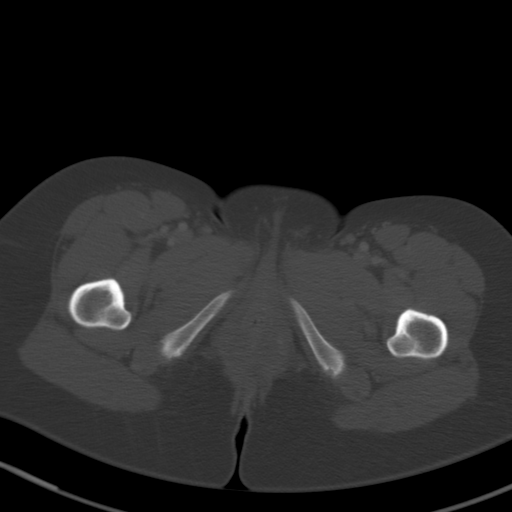
[im 11/82  soft-tissue]
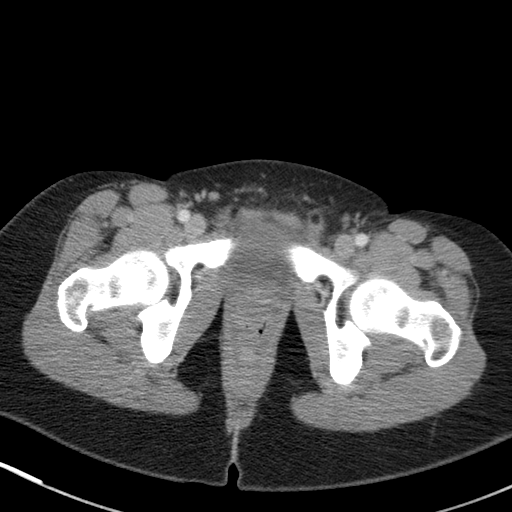
[im 17/82  soft-tissue]
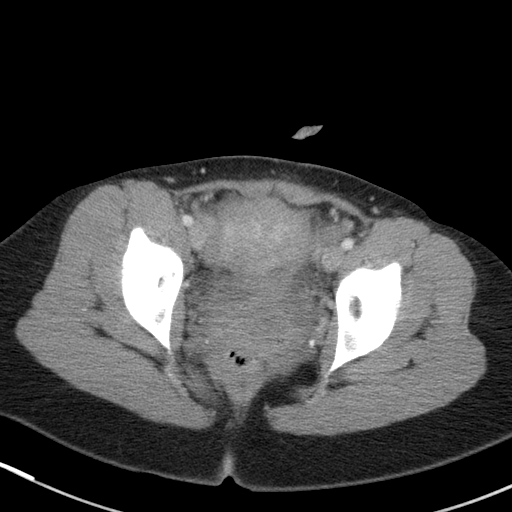
[im 21/82  soft-tissue]
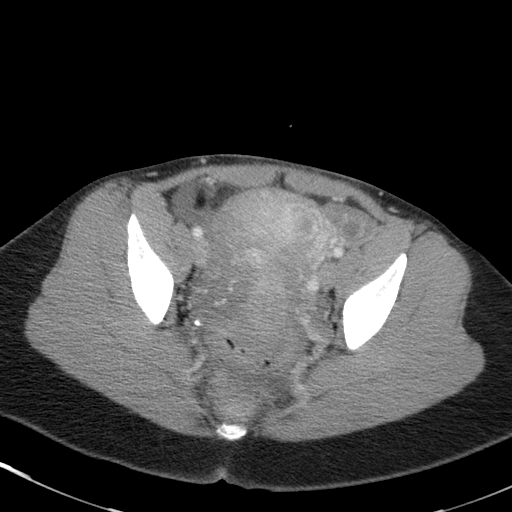
[im 28/82  soft-tissue]
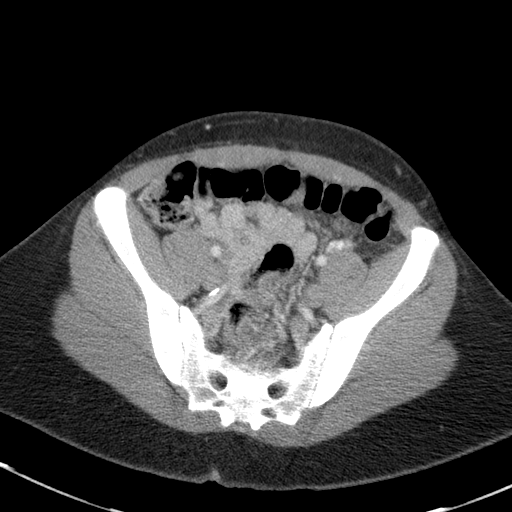
[im 34/82  soft-tissue]
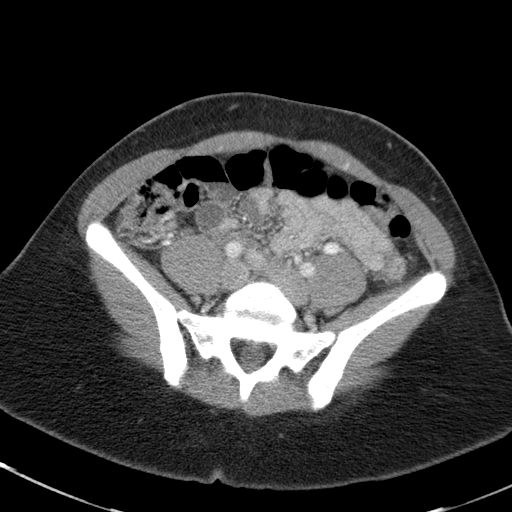
[im 38/82  soft-tissue]
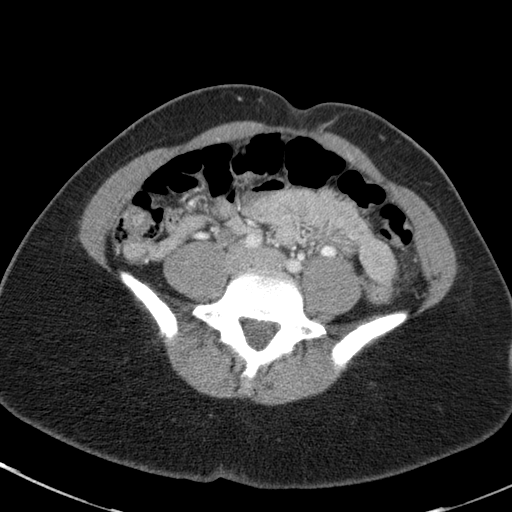
[im 44/82  soft-tissue]
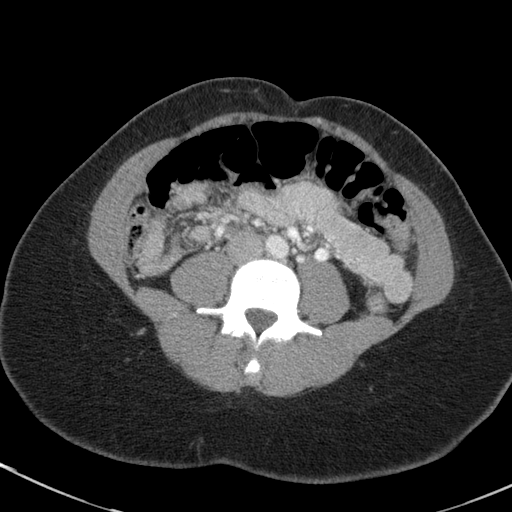
[im 48/82  soft-tissue]
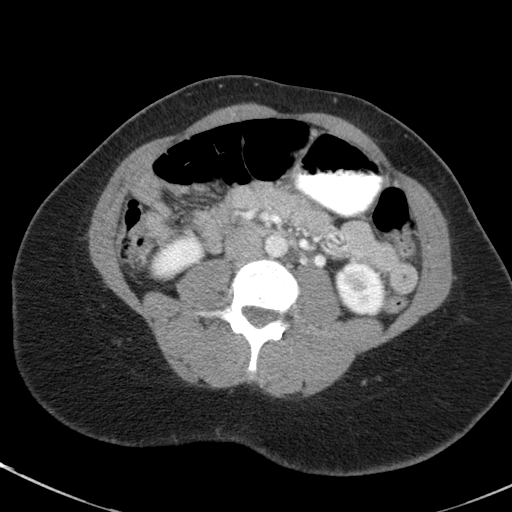
[im 48/82  bone]
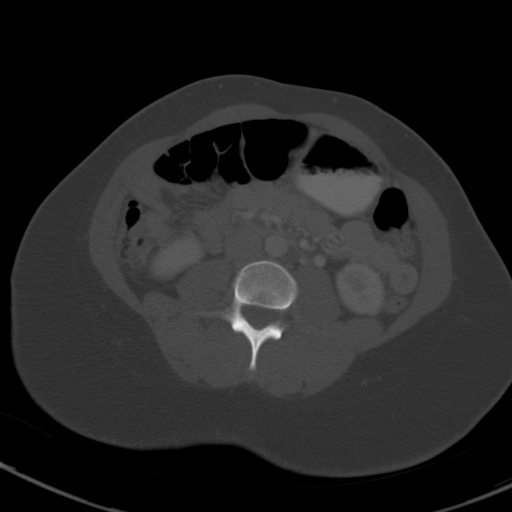
[im 55/82  soft-tissue]
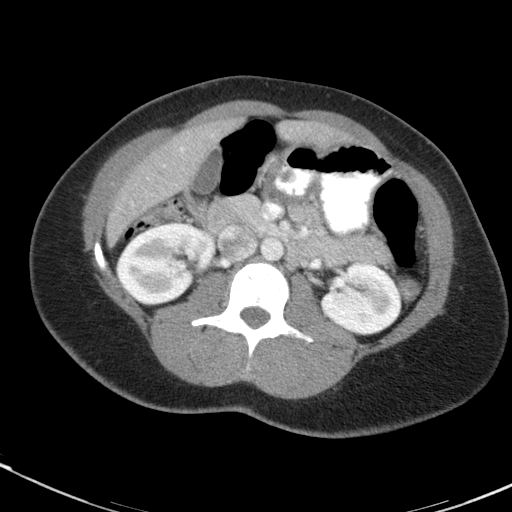
[im 61/82  soft-tissue]
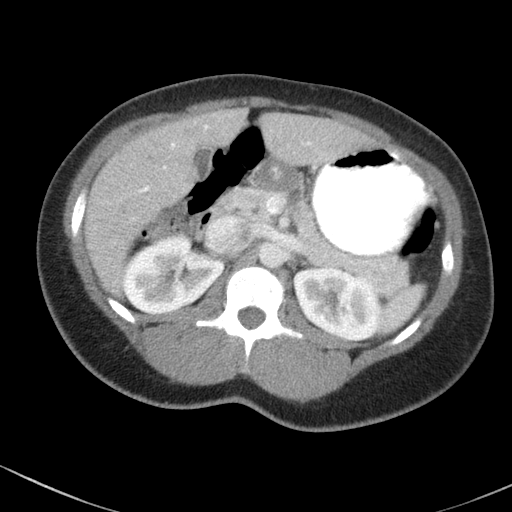
[im 65/82  soft-tissue]
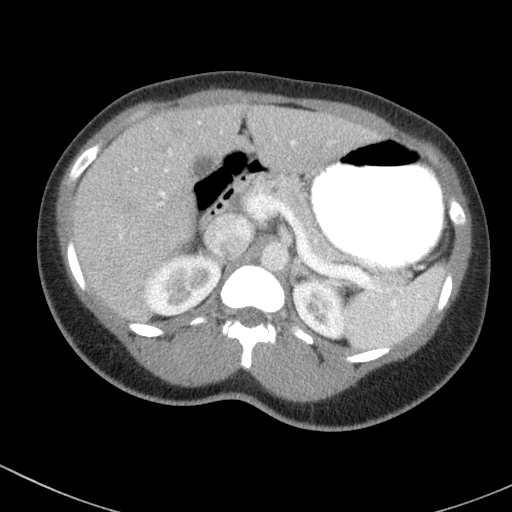
[im 71/82  soft-tissue]
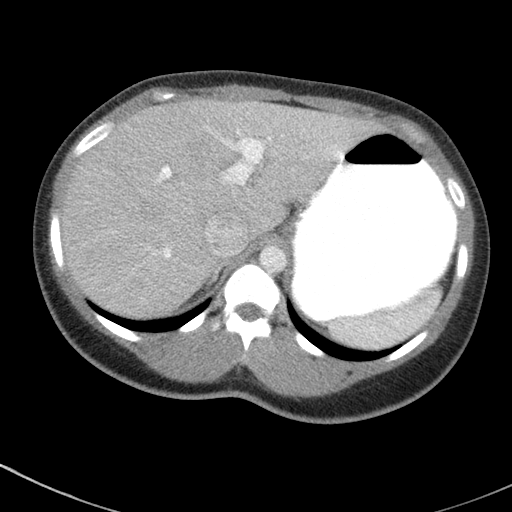
[im 78/82  soft-tissue]
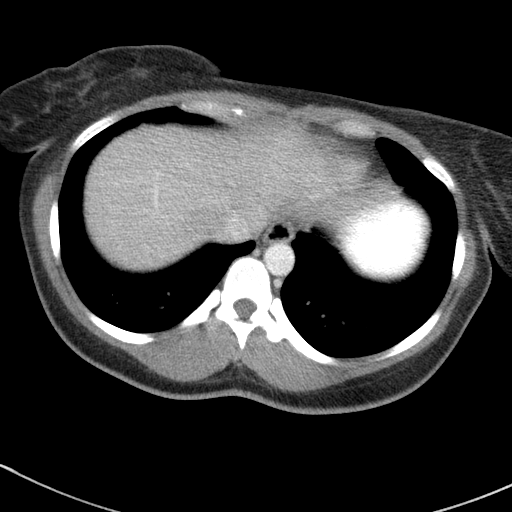

[Series 5: abd/pelvis 3.0 coronal · coronal · 0.65mm/px · 3 of 88 slices shown]
[im 30/88  soft-tissue]
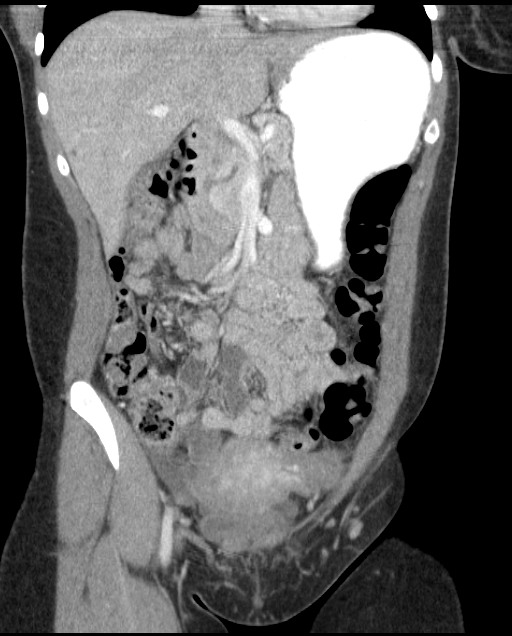
[im 39/88  soft-tissue]
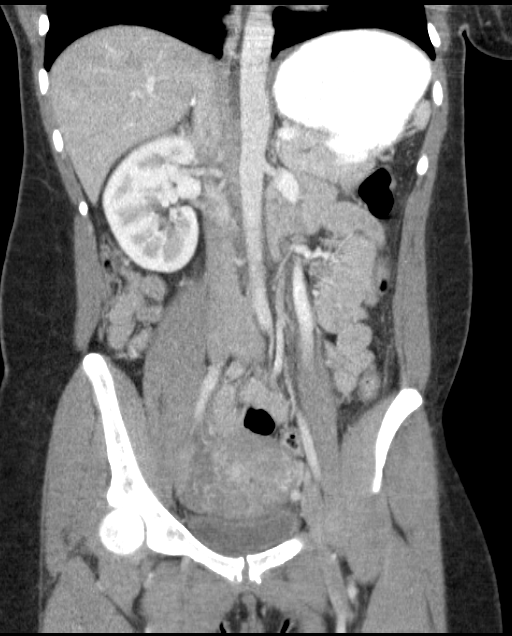
[im 49/88  soft-tissue]
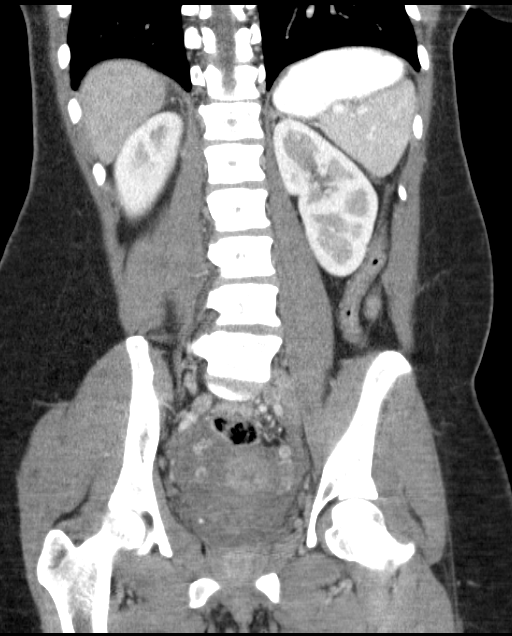

[17 of 46 positions shown; findings below may reference images not displayed]

FINDINGS: The lung bases are unremarkable.

The liver, spleen, adrenals, pancreas, kidneys are unremarkable. The
gallbladder gallbladder fossa are negative.

There is no evidence of abdominal aortic aneurysm. The celiac, SMA,
IMA, portal vein, SMV are opacified.

The bowel is negative. The appendix is identified and is
unremarkable.

A small amount of free fluid is appreciated within the pelvis.

There is no evidence of abdominal or pelvic masses, adenopathy, or
loculated fluid collections.

No abdominal wall or inguinal hernia appreciated. There are no
aggressive appearing osseous lesions.
IMPRESSION: Small amount of free fluid within the pelvis likely physiologic, an
indolent inflammatory or infectious process cannot be excluded.
Otherwise there is no evidence of abdominal or pelvic pathology.

## 2015-11-25 ENCOUNTER — Emergency Department (HOSPITAL_BASED_OUTPATIENT_CLINIC_OR_DEPARTMENT_OTHER)
Admission: EM | Admit: 2015-11-25 | Discharge: 2015-11-25 | Disposition: A | Discharge status: Home / Self Care | Payer: No Typology Code available for payment source | Attending: Emergency Medicine | Admitting: Emergency Medicine

## 2015-11-25 ENCOUNTER — Encounter (HOSPITAL_BASED_OUTPATIENT_CLINIC_OR_DEPARTMENT_OTHER): Payer: Self-pay

## 2015-11-25 DIAGNOSIS — Y999 Unspecified external cause status: Secondary | ICD-10-CM | POA: Diagnosis not present

## 2015-11-25 DIAGNOSIS — Y92410 Unspecified street and highway as the place of occurrence of the external cause: Secondary | ICD-10-CM | POA: Diagnosis not present

## 2015-11-25 DIAGNOSIS — F17200 Nicotine dependence, unspecified, uncomplicated: Secondary | ICD-10-CM | POA: Diagnosis not present

## 2015-11-25 DIAGNOSIS — M545 Low back pain, unspecified: Secondary | ICD-10-CM

## 2015-11-25 DIAGNOSIS — Z79899 Other long term (current) drug therapy: Secondary | ICD-10-CM | POA: Diagnosis not present

## 2015-11-25 DIAGNOSIS — Y939 Activity, unspecified: Secondary | ICD-10-CM | POA: Diagnosis not present

## 2015-11-25 HISTORY — DX: Disorder of kidney and ureter, unspecified: N28.9

## 2015-11-25 MED ORDER — METHOCARBAMOL 500 MG PO TABS: 500.0000 mg | ORAL_TABLET | Freq: Two times a day (BID) | ORAL | Status: DC

## 2015-11-25 MED FILL — METHOCARBAMOL 500 MG TABLET: 500 | 10 days supply | Qty: 20 | Fill #0
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
## 2015-11-25 NOTE — ED Notes (Signed)
Tyler, PA at bedside 
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
## 2015-11-25 NOTE — ED Notes (Signed)
Pt made aware to return if symptoms worsen or if any life threatening symptoms occur.   

## 2016-05-02 ENCOUNTER — Emergency Department (HOSPITAL_BASED_OUTPATIENT_CLINIC_OR_DEPARTMENT_OTHER): Payer: 59

## 2016-05-02 ENCOUNTER — Encounter (HOSPITAL_BASED_OUTPATIENT_CLINIC_OR_DEPARTMENT_OTHER): Payer: Self-pay | Admitting: Emergency Medicine

## 2016-05-02 ENCOUNTER — Emergency Department (HOSPITAL_BASED_OUTPATIENT_CLINIC_OR_DEPARTMENT_OTHER)
Admission: EM | Admit: 2016-05-02 | Discharge: 2016-05-02 | Disposition: A | Discharge status: Home / Self Care | Payer: 59 | Attending: Emergency Medicine | Admitting: Emergency Medicine

## 2016-05-02 DIAGNOSIS — R0602 Shortness of breath: Secondary | ICD-10-CM | POA: Diagnosis present

## 2016-05-02 DIAGNOSIS — B9789 Other viral agents as the cause of diseases classified elsewhere: Secondary | ICD-10-CM

## 2016-05-02 DIAGNOSIS — Z79899 Other long term (current) drug therapy: Secondary | ICD-10-CM | POA: Insufficient documentation

## 2016-05-02 DIAGNOSIS — J069 Acute upper respiratory infection, unspecified: Secondary | ICD-10-CM | POA: Diagnosis not present

## 2016-05-02 DIAGNOSIS — F17210 Nicotine dependence, cigarettes, uncomplicated: Secondary | ICD-10-CM | POA: Diagnosis not present

## 2016-05-02 LAB — CBC WITH DIFFERENTIAL/PLATELET
BASOS ABS: 0 10*3/uL (ref 0.0–0.1)
Basophils Relative: 0 %
EOS ABS: 0.1 10*3/uL (ref 0.0–0.7)
EOS PCT: 2 %
HCT: 35.4 % — ABNORMAL LOW (ref 36.0–46.0)
Hemoglobin: 11.7 g/dL — ABNORMAL LOW (ref 12.0–15.0)
Lymphocytes Relative: 49 %
Lymphs Abs: 2.7 10*3/uL (ref 0.7–4.0)
MCH: 28.9 pg (ref 26.0–34.0)
MCHC: 33.1 g/dL (ref 30.0–36.0)
MCV: 87.4 fL (ref 78.0–100.0)
MONO ABS: 0.6 10*3/uL (ref 0.1–1.0)
Monocytes Relative: 11 %
Neutro Abs: 2.1 10*3/uL (ref 1.7–7.7)
Neutrophils Relative %: 38 %
PLATELETS: 147 10*3/uL — AB (ref 150–400)
RBC: 4.05 MIL/uL (ref 3.87–5.11)
RDW: 13.6 % (ref 11.5–15.5)
WBC: 5.4 10*3/uL (ref 4.0–10.5)

## 2016-05-02 LAB — D-DIMER, QUANTITATIVE (NOT AT ARMC)

## 2016-05-02 LAB — BASIC METABOLIC PANEL
ANION GAP: 7 (ref 5–15)
BUN: 10 mg/dL (ref 6–20)
CALCIUM: 9.1 mg/dL (ref 8.9–10.3)
CO2: 25 mmol/L (ref 22–32)
Chloride: 106 mmol/L (ref 101–111)
Creatinine, Ser: 0.77 mg/dL (ref 0.44–1.00)
GFR calc Af Amer: >60 mL/min (ref >60)
GFR calc non Af Amer: >60 mL/min (ref >60)
GLUCOSE: 97 mg/dL (ref 65–99)
POTASSIUM: 3.7 mmol/L (ref 3.5–5.1)
SODIUM: 138 mmol/L (ref 135–145)

## 2016-05-02 LAB — TROPONIN I: Troponin I: <0.03 ng/mL (ref <0.03)

## 2016-05-02 MED ORDER — BENZONATATE 100 MG PO CAPS: 100.0000 mg | ORAL_CAPSULE | Freq: Three times a day (TID) | ORAL | 0 refills | Status: DC

## 2016-05-02 MED ORDER — ALBUTEROL SULFATE HFA 108 (90 BASE) MCG/ACT IN AERS
2.0000 | INHALATION_SPRAY | Freq: Four times a day (QID) | RESPIRATORY_TRACT | Status: DC | PRN
  Administered 2016-05-02: 2 via RESPIRATORY_TRACT
  Filled 2016-05-02: qty 6.7

## 2016-05-02 MED ORDER — ALBUTEROL SULFATE (2.5 MG/3ML) 0.083% IN NEBU
5.0000 mg | INHALATION_SOLUTION | Freq: Once | RESPIRATORY_TRACT | Status: AC
  Administered 2016-05-02: 5 mg via RESPIRATORY_TRACT
  Filled 2016-05-02: qty 6

## 2016-05-02 MED ORDER — IPRATROPIUM-ALBUTEROL 0.5-2.5 (3) MG/3ML IN SOLN
3.0000 mL | Freq: Once | RESPIRATORY_TRACT | Status: AC
  Administered 2016-05-02: 3 mL via RESPIRATORY_TRACT
  Filled 2016-05-02: qty 3
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
## 2016-05-02 NOTE — Discharge Instructions (Signed)
Read the information below.  Your labs and imaging are re-assuring. You endorse improvement in your symptoms following breathing treatment.  I have prescribed tessalon perles for cough relief. I have also provided an inhaler for relief of wheezing and shortness of breath.  Use the prescribed medication as directed.  Please discuss all new medications with your pharmacist. Follow up with your primary doctor if symptoms persist for more than 7-10 days.    You may return to the Emergency Department at any time for worsening condition or any new symptoms that concern you. Return to ED if you develop worsening shortness of breath, chest pain, leg swelling/pain, or any other new/concerning symptoms.

## 2016-05-02 NOTE — ED Provider Notes (Signed)
[facility]   CSN: [id number] Arrival date & time: 05/02/16  1836  By signing my name below, I, Sabrina Owens, attest that this documentation has been prepared under the direction and in the presence of [name], PA-C. Electronically Signed: [name], ED Scribe. 05/02/16. 8:27 PM.   History   Chief Complaint Chief Complaint  Patient presents with  . URI    HPI Comments: Sabrina Owens is a 30 y.o. female with a hx of hypertension who presents to the Emergency Department complaining of gradual onset, persistent moderate shortness of breath she describes as "being winded" onset 2 days ago. She reports associated chest tightness, mild non-productive cough (worse at night), watery eyes, and nasal congestion with clear rhinorrhea. She notes that she has right sided chest pain she describes as sharp with deep breathing. No alleviating factors noted. Pt has has tried nyquil PTA with minimal relief. She has an allergy to Cefazolin. She reports a hx of Bronchitis and states that she had these symptoms then except her cough was worse. She denies any recent long car/plane trips, immobilizations, hormone therapy, or surgeries. She also denies a hx of cancer or PE/DVT. She denies any fever, chills, abdominal pain, nausea, vomiting, sore throat, generalized body aches, chest pain, leg swelling, urinary symptoms, generalized rash, syncope, or any other symptoms.   The history is provided by the patient. No language interpreter was used.    Past Medical History:  Diagnosis Date  . Heart palpitations   . Irregular menstrual bleeding   . Preeclampsia   . Renal disorder     Patient Active Problem List   Diagnosis Date Noted  . Hepatitis C antibody test positive 10/11/2014  . Leukocytopenia 09/27/2014  . Thrombocytopenia (HCC) 09/27/2014    Past Surgical History:  Procedure Laterality Date  . CESAREAN SECTION    . ENDOMETRIAL ABLATION    . SOFT TISSUE  CYST EXCISION    . TUBAL LIGATION    . WISDOM TOOTH EXTRACTION      OB History    No data available       Home Medications    Prior to Admission medications   Medication Sig Start Date End Date Taking? Authorizing Provider  Sertraline HCl (ZOLOFT PO) Take by mouth.   Yes Historical Provider, MD  albuterol (PROVENTIL HFA;VENTOLIN HFA) 108 (90 BASE) MCG/ACT inhaler Inhale 2 puffs into the lungs every 2 (two) hours as needed for wheezing or shortness of breath (cough). 05/02/13   [name], MD  benzonatate (TESSALON) 100 MG capsule Take 1 capsule (100 mg total) by mouth every 8 (eight) hours. 05/02/16   [name], PA-C  methocarbamol (ROBAXIN) 500 MG tablet Take 1 tablet (500 mg total) by mouth 2 (two) times daily. 11/25/15   [name], PA-C    Family History History reviewed. No pertinent family history.  Social History Social History  Substance Use Topics  . Smoking status: Current Some Day Smoker    Types: Cigarettes  . Smokeless tobacco: Never Used  . Alcohol use Yes     Comment: occ     Allergies   Ancef [cefazolin]   Review of Systems Review of Systems  Constitutional: Negative for chills and fever.  HENT: Positive for congestion and rhinorrhea.   Eyes: Positive for discharge.  Respiratory: Positive for cough, chest tightness and shortness of breath.   Cardiovascular: Positive for chest pain ( right sided). Negative for leg swelling.  Gastrointestinal: Negative for abdominal pain,  nausea and vomiting.  Genitourinary: Negative for dysuria and hematuria.  Musculoskeletal: Negative for arthralgias and myalgias.  Skin: Negative for rash.  Neurological: Negative for syncope.     Physical Exam Updated Vital Signs BP 118/74 (BP Location: Left Arm)   Pulse 75   Temp 98.3 F (36.8 C) (Oral)   Resp (!) 40   Ht 5\' 1"  (1.549 m)   Wt 167 lb 4 oz (75.9 kg)   LMP 04/23/2016 (Approximate)   SpO2 100%   BMI 31.60 kg/m   Physical Exam  Constitutional:  She appears well-developed and well-nourished. No distress.  HENT:  Head: Normocephalic and atraumatic.  Right Ear: Tympanic membrane normal.  Left Ear: Tympanic membrane normal.  Nose: Mucosal edema and rhinorrhea present.  Mouth/Throat: Uvula is midline and oropharynx is clear and moist. No trismus in the jaw. No oropharyngeal exudate or posterior oropharyngeal erythema.  Eyes: Conjunctivae and EOM are normal. Pupils are equal, round, and reactive to light. Right eye exhibits no discharge. Left eye exhibits no discharge. No scleral icterus.  Neck: Normal range of motion and phonation normal. Neck supple. No neck rigidity. Normal range of motion present.  No nuchal rigidity.   Cardiovascular: Normal rate, regular rhythm, normal heart sounds and intact distal pulses.   No murmur heard. Pulmonary/Chest: Effort normal and breath sounds normal. No stridor. No respiratory distress. She has no wheezes. She has no rales.  Symmetric chest expansion. Respirations unlabored. No wheezing or rales. No hypoxia.   Abdominal: Soft. Bowel sounds are normal. She exhibits no distension. There is no tenderness. There is no rigidity, no rebound, no guarding and no CVA tenderness.  Musculoskeletal: Normal range of motion.  No lower extremity swelling b/l.   Lymphadenopathy:    She has no cervical adenopathy.  Neurological: She is alert. She is not disoriented. Coordination and gait normal. GCS eye subscore is 4. GCS verbal subscore is 5. GCS motor subscore is 6.  Skin: Skin is warm and dry. She is not diaphoretic.  Psychiatric: She has a normal mood and affect. Her behavior is normal.  Nursing note and vitals reviewed.    ED Treatments / Results  DIAGNOSTIC STUDIES: Oxygen Saturation is 100% on RA, normal by my interpretation.    COORDINATION OF CARE: 8:25 PM- Pt advised of plan for treatment and pt agrees. Pt will receive chest x-ray for further evaluation.    Labs (all labs ordered are listed, but  only abnormal results are displayed) Labs Reviewed  CBC WITH DIFFERENTIAL/PLATELET - Abnormal; Notable for the following:       Result Value   Hemoglobin 11.7 (*)    HCT 35.4 (*)    Platelets 147 (*)    All other components within normal limits  BASIC METABOLIC PANEL  D-DIMER, QUANTITATIVE (NOT [facility])  TROPONIN I    EKG  EKG Interpretation  Date/Time:  Wednesday May 02 2016 20:39:19 EST Ventricular Rate:  62 PR Interval:    QRS Duration: 92 QT Interval:  381 QTC Calculation: 387 R Axis:   8 Text Interpretation:  Sinus rhythm RSR' in V1 or V2, probably normal variant No old tracing to compare Confirmed by FLOYD MD, DANIEL [phone]([id number]) on 05/02/2016 8:42:12 PM       Radiology Dg Chest 2 View  Result Date: 05/02/2016 CLINICAL DATA:  Chest tightness with cough and shortness of breath EXAM: CHEST  2 VIEW COMPARISON:  05/02/2013 FINDINGS: The heart size and mediastinal contours are within normal limits. Both lungs are clear.  The visualized skeletal structures are unremarkable. Bilateral nipple rings. IMPRESSION: No active cardiopulmonary disease. Electronically Signed   By: Jasmine PangKim  Fujinaga M.D.   On: 05/02/2016 21:20    Procedures Procedures (including critical care time)  Medications Ordered in ED Medications  ipratropium-albuterol (DUONEB) 0.5-2.5 (3) MG/3ML nebulizer solution 3 mL (3 mLs Nebulization Given 05/02/16 2112)  albuterol (PROVENTIL) (2.5 MG/3ML) 0.083% nebulizer solution 5 mg (5 mg Nebulization Given 05/02/16 2238)     Initial Impression / Assessment and Plan / ED Course  Arvilla MeresAshley Maxima Skelton, PA-C has reviewed the triage vital signs and the nursing notes.  Pertinent labs & imaging results that were available during my care of the patient were reviewed by me and considered in my medical decision making (see chart for details).  Clinical Course as of May 03 241  Wed May 02, 2016  2140 DG Chest 2 View [AM]  2154 Patient endorses improvement in breathing s/p  treatment. Still experiencing chest tightness. Lungs CTABL.   [AM]  2300 On re-evaluation patient endorses improvement. Lungs CTABL.   [AM]    Clinical Course User Index [AM] Lona KettleAshley Laurel Aanchal Cope, PA-C   Patient presents to ED with complaint of SOB, chest tightness, cough, and [place] x 2 days. Patient is afebrile and non-toxic appearing in NAD. Initial vitals remarkable for tachypnea. However, re-assessment had nml respirations. On my assessment patient breathing is unlabored, nml respirations. No Hypoxia. Lungs CTABL. Heart RRR. Abdomen soft, non-tender, non-distended. Given complaint of SOB and right sided CP will check labs, EKG, and CXR. Breathing treatment initiated.   CBC and BMP grossly unremarkable. D-dimer negative - low suspicion for PE. CXR negative for acute infiltrate, pleural effusion, or PTX. EKG shows sinus rhythm with RSR in V1/V2 likely normal variant. Heart score 2. Low suspicion for ACS. Suspect sxs secondary to viral uri with cough.   Pt endorses improvement following breathing treatments. Respirations remain unlabored. No hypoxia. Lungs CTABL. Discussed results and plan with patient. Symptomatic tx discussed. Inhaler provided. Rx tessalon perles. Follow up with PCP if sxs persist. Return precautions given. Pt voiced understanding an is agreeable.   Final Clinical Impressions(s) / ED Diagnoses   Final diagnoses:  Viral URI with cough    New Prescriptions Discharge Medication List as of 05/02/2016 11:13 PM    START taking these medications   Details  benzonatate (TESSALON) 100 MG capsule Take 1 capsule (100 mg total) by mouth every 8 (eight) hours., Starting Wed 05/02/2016, Print       I personally performed the services described in this documentation, which was scribed in my presence. The recorded information has been reviewed and is accurate.    Lona KettleAshley Laurel Rudi Knippenberg, PA-C 05/03/16 0242    Melene Planan Floyd, DO 05/03/16 1914

## 2016-05-02 NOTE — ED Triage Notes (Signed)
Nurse first-O2 sat 100%-HR 80- RR 20-pt NAD-steady gait

## 2016-05-02 NOTE — ED Triage Notes (Signed)
Patient c/o URI started yesterday, speaking in complete sentences. Patient has tried Nyquil without relief.

## 2016-08-02 ENCOUNTER — Encounter (HOSPITAL_BASED_OUTPATIENT_CLINIC_OR_DEPARTMENT_OTHER): Payer: Self-pay | Admitting: *Deleted

## 2016-08-02 ENCOUNTER — Emergency Department (HOSPITAL_BASED_OUTPATIENT_CLINIC_OR_DEPARTMENT_OTHER)
Admission: EM | Admit: 2016-08-02 | Discharge: 2016-08-02 | Disposition: A | Discharge status: Home / Self Care | Payer: PRIVATE HEALTH INSURANCE | Attending: Emergency Medicine | Admitting: Emergency Medicine

## 2016-08-02 DIAGNOSIS — N3000 Acute cystitis without hematuria: Secondary | ICD-10-CM | POA: Diagnosis not present

## 2016-08-02 DIAGNOSIS — F17210 Nicotine dependence, cigarettes, uncomplicated: Secondary | ICD-10-CM | POA: Insufficient documentation

## 2016-08-02 DIAGNOSIS — M545 Low back pain: Secondary | ICD-10-CM | POA: Insufficient documentation

## 2016-08-02 DIAGNOSIS — Z79899 Other long term (current) drug therapy: Secondary | ICD-10-CM | POA: Diagnosis not present

## 2016-08-02 LAB — URINALYSIS, ROUTINE W REFLEX MICROSCOPIC
Glucose, UA: NEGATIVE mg/dL
Ketones, ur: 15 mg/dL — AB
Nitrite: NEGATIVE
Protein, ur: 30 mg/dL — AB
Specific Gravity, Urine: 1.026 (ref 1.005–1.030)
pH: 6 (ref 5.0–8.0)

## 2016-08-02 LAB — URINALYSIS, MICROSCOPIC (REFLEX)

## 2016-08-02 LAB — PREGNANCY, URINE: Preg Test, Ur: NEGATIVE

## 2016-08-02 MED ORDER — NAPROXEN 500 MG PO TABS: 500.0000 mg | ORAL_TABLET | Freq: Two times a day (BID) | ORAL | 0 refills | Status: DC

## 2016-08-02 MED ORDER — SULFAMETHOXAZOLE-TRIMETHOPRIM 800-160 MG PO TABS: 1.0000 | ORAL_TABLET | Freq: Two times a day (BID) | ORAL | 0 refills | Status: AC

## 2016-08-02 MED FILL — SULFAMETHOXAZOLE/TMP DS TAB: 800-160 | 7 days supply | Qty: 14 | Fill #0

## 2016-08-02 MED FILL — NAPROXEN 500 MG TABLET: 500 | 15 days supply | Qty: 30 | Fill #0

## 2016-08-02 NOTE — ED Notes (Signed)
Work note given. Pt directed to pharmacy to pick up Rx 

## 2016-08-02 NOTE — ED Provider Notes (Signed)
[facility]   CSN: [id number] Arrival date & time: 08/02/16  0935     History   Chief Complaint Chief Complaint  Patient presents with  . Back Pain    HPI Sabrina Owens is a 31 y.o. female.  Patient is a 31 year old female who presents with back pain. She states over the last 2 days she's had lower back pain associated with myalgias and fatigue. She also has had some urinary frequency and urgency with some pressure on urination. She denies any vaginal bleeding or discharge. She denies any nausea or vomiting. She denies any known fevers. She denies any coughing or cold symptoms. No sore throat. She's been using over-the-counter medications without improvement in symptoms. She does have a history of prior UTIs.      Past Medical History:  Diagnosis Date  . Heart palpitations   . Irregular menstrual bleeding   . Preeclampsia   . Renal disorder     Patient Active Problem List   Diagnosis Date Noted  . Hepatitis C antibody test positive 10/11/2014  . Leukocytopenia 09/27/2014  . Thrombocytopenia (HCC) 09/27/2014    Past Surgical History:  Procedure Laterality Date  . CESAREAN SECTION    . ENDOMETRIAL ABLATION    . SOFT TISSUE CYST EXCISION    . TUBAL LIGATION    . WISDOM TOOTH EXTRACTION      OB History    No data available       Home Medications    Prior to Admission medications   Medication Sig Start Date End Date Taking? Authorizing Provider  albuterol (PROVENTIL HFA;VENTOLIN HFA) 108 (90 BASE) MCG/ACT inhaler Inhale 2 puffs into the lungs every 2 (two) hours as needed for wheezing or shortness of breath (cough). 05/02/13   Wayland SalinasJohn Bednar, MD  benzonatate (TESSALON) 100 MG capsule Take 1 capsule (100 mg total) by mouth every 8 (eight) hours. 05/02/16   Lona KettleAshley Laurel Meyer, PA-C  methocarbamol (ROBAXIN) 500 MG tablet Take 1 tablet (500 mg total) by mouth 2 (two) times daily. 11/25/15   Audry Piliyler Mohr, PA-C  Sertraline HCl (ZOLOFT PO) Take  by mouth.    Historical Provider, MD  sulfamethoxazole-trimethoprim (BACTRIM DS,SEPTRA DS) 800-160 MG tablet Take 1 tablet by mouth 2 (two) times daily. 08/02/16 08/09/16  Rolan BuccoMelanie Venissa Nappi, MD    Family History History reviewed. No pertinent family history.  Social History Social History  Substance Use Topics  . Smoking status: Current Some Day Smoker    Types: Cigarettes  . Smokeless tobacco: Never Used  . Alcohol use Yes     Comment: occ     Allergies   Ancef [cefazolin]   Review of Systems Review of Systems  Constitutional: Positive for fatigue. Negative for chills, diaphoresis and fever.  HENT: Negative for congestion, rhinorrhea and sneezing.   Eyes: Negative.   Respiratory: Negative for cough, chest tightness and shortness of breath.   Cardiovascular: Negative for chest pain and leg swelling.  Gastrointestinal: Negative for abdominal pain, blood in stool, diarrhea, nausea and vomiting.  Genitourinary: Positive for frequency and urgency. Negative for difficulty urinating, flank pain and hematuria.  Musculoskeletal: Positive for back pain. Negative for arthralgias.  Skin: Negative for rash.  Neurological: Negative for dizziness, speech difficulty, weakness, numbness and headaches.     Physical Exam Updated Vital Signs BP 112/69 (BP Location: Left Arm)   Pulse 87   Temp 99.1 F (37.3 C) (Oral)   Resp 18   Ht 5' (1.524 m)  Wt 167 lb (75.8 kg)   LMP 07/22/2016   SpO2 100%   BMI 32.61 kg/m   Physical Exam  Constitutional: She is oriented to person, place, and time. She appears well-developed and well-nourished.  HENT:  Head: Normocephalic and atraumatic.  Eyes: Pupils are equal, round, and reactive to light.  Neck: Normal range of motion. Neck supple.  Cardiovascular: Normal rate, regular rhythm and normal heart sounds.   Pulmonary/Chest: Effort normal and breath sounds normal. No respiratory distress. She has no wheezes. She has no rales. She exhibits no  tenderness.  Abdominal: Soft. Bowel sounds are normal. There is no tenderness. There is no rebound and no guarding.  No CVA tenderness  Musculoskeletal: Normal range of motion. She exhibits no edema.  Lymphadenopathy:    She has no cervical adenopathy.  Neurological: She is alert and oriented to person, place, and time.  Skin: Skin is warm and dry. No rash noted.  Psychiatric: She has a normal mood and affect.     ED Treatments / Results  Labs (all labs ordered are listed, but only abnormal results are displayed) Labs Reviewed  URINALYSIS, ROUTINE W REFLEX MICROSCOPIC - Abnormal; Notable for the following:       Result Value   Color, Urine AMBER (*)    APPearance CLOUDY (*)    Hgb urine dipstick SMALL (*)    Bilirubin Urine SMALL (*)    Ketones, ur 15 (*)    Protein, ur 30 (*)    Leukocytes, UA MODERATE (*)    All other components within normal limits  URINALYSIS, MICROSCOPIC (REFLEX) - Abnormal; Notable for the following:    Bacteria, UA MANY (*)    Squamous Epithelial / LPF 6-30 (*)    All other components within normal limits  PREGNANCY, URINE    EKG  EKG Interpretation None       Radiology No results found.  Procedures Procedures (including critical care time)  Medications Ordered in ED Medications - No data to display   Initial Impression / Assessment and Plan / ED Course  I have reviewed the triage vital signs and the nursing notes.  Pertinent labs & imaging results that were available during my care of the patient were reviewed by me and considered in my medical decision making (see chart for details).     Patient was started on Naprosyn and Bactrim. She doesn't look systemically ill. There is no suggestions of pyelonephritis. She was discharged from good condition. Return precautions were given.  Final Clinical Impressions(s) / ED Diagnoses   Final diagnoses:  Acute bilateral low back pain, with sciatica presence unspecified  Acute cystitis  without hematuria    New Prescriptions New Prescriptions   SULFAMETHOXAZOLE-TRIMETHOPRIM (BACTRIM DS,SEPTRA DS) 800-160 MG TABLET    Take 1 tablet by mouth 2 (two) times daily.     Rolan Bucco, MD 08/02/16 1049

## 2016-08-02 NOTE — ED Triage Notes (Signed)
Pt reports low back pain and "pressure" when she urinates, states 'I think I have a uti".

## 2016-09-26 ENCOUNTER — Emergency Department (HOSPITAL_BASED_OUTPATIENT_CLINIC_OR_DEPARTMENT_OTHER)
Admission: EM | Admit: 2016-09-26 | Discharge: 2016-09-26 | Disposition: A | Discharge status: Home / Self Care | Payer: No Typology Code available for payment source | Attending: Emergency Medicine | Admitting: Emergency Medicine

## 2016-09-26 ENCOUNTER — Encounter (HOSPITAL_BASED_OUTPATIENT_CLINIC_OR_DEPARTMENT_OTHER): Payer: Self-pay | Admitting: Emergency Medicine

## 2016-09-26 DIAGNOSIS — F17210 Nicotine dependence, cigarettes, uncomplicated: Secondary | ICD-10-CM | POA: Insufficient documentation

## 2016-09-26 DIAGNOSIS — R059 Cough, unspecified: Secondary | ICD-10-CM

## 2016-09-26 DIAGNOSIS — R05 Cough: Secondary | ICD-10-CM | POA: Diagnosis present

## 2016-09-26 DIAGNOSIS — J302 Other seasonal allergic rhinitis: Secondary | ICD-10-CM | POA: Diagnosis not present

## 2016-09-26 MED ORDER — FLUTICASONE PROPIONATE 50 MCG/ACT NA SUSP: 1.0000 | Freq: Every day | NASAL | 0 refills | Status: DC

## 2016-09-26 MED ORDER — BENZONATATE 100 MG PO CAPS: 100.0000 mg | ORAL_CAPSULE | Freq: Three times a day (TID) | ORAL | 0 refills | Status: DC

## 2016-09-26 MED ORDER — CETIRIZINE HCL 10 MG PO TABS: 10.0000 mg | ORAL_TABLET | Freq: Every day | ORAL | 0 refills | Status: DC

## 2016-09-26 MED FILL — BENZONATATE 100 MG CAP: 100 | 7 days supply | Qty: 21 | Fill #0

## 2016-09-26 MED FILL — FLUTICASONE PROP 50 MCG SPR: 50 | 30 days supply | Qty: 16 | Fill #0

## 2016-09-26 MED FILL — ALL DAY ALLERGY 10 MG TAB: 10 | 100 days supply | Qty: 100 | Fill #0

## 2016-09-26 NOTE — ED Provider Notes (Signed)
[facility]   CSN: [id number] Arrival date & time: 09/26/16  0843     History   Chief Complaint Chief Complaint  Patient presents with  . Cough    HPI Sabrina Owens is a 31 y.o. female presenting with 4 days of nonproductive cough which is causing her to have a sore throat and headache. She explains that she has been having difficulties with seasonal allergies and was taking antihistamines but Better. She discontinued antihistamine and that is when cough started. She endorses postnasal drip and left ear discomfort. She has tried Mucinex without much relief and ibuprofen which has helped her with the headache. She explains that it does not feel like a cold or flu. She denies fever, chills, nausea, vomiting, diarrhea, myalgias, or other symptoms.  HPI  Past Medical History:  Diagnosis Date  . Heart palpitations   . Irregular menstrual bleeding   . Preeclampsia   . Renal disorder     Patient Active Problem List   Diagnosis Date Noted  . Hepatitis C antibody test positive 10/11/2014  . Leukocytopenia 09/27/2014  . Thrombocytopenia (HCC) 09/27/2014    Past Surgical History:  Procedure Laterality Date  . CESAREAN SECTION    . ENDOMETRIAL ABLATION    . SOFT TISSUE CYST EXCISION    . TUBAL LIGATION    . WISDOM TOOTH EXTRACTION      OB History    No data available       Home Medications    Prior to Admission medications   Medication Sig Start Date End Date Taking? Authorizing Provider  albuterol (PROVENTIL HFA;VENTOLIN HFA) 108 (90 BASE) MCG/ACT inhaler Inhale 2 puffs into the lungs every 2 (two) hours as needed for wheezing or shortness of breath (cough). 05/02/13  Yes [name], MD  benzonatate (TESSALON) 100 MG capsule Take 1 capsule (100 mg total) by mouth every 8 (eight) hours. 09/26/16   Georgiana Shore, PA-C  cetirizine (ZYRTEC ALLERGY) 10 MG tablet Take 1 tablet (10 mg total) by mouth daily. 09/26/16   Mathews Robinsons B, PA-C    fluticasone (FLONASE) 50 MCG/ACT nasal spray Place 1 spray into both nostrils daily. 09/26/16   Georgiana Shore, PA-C    Family History No family history on file.  Social History Social History  Substance Use Topics  . Smoking status: Current Some Day Smoker    Types: Cigarettes  . Smokeless tobacco: Never Used  . Alcohol use Yes     Comment: occ     Allergies   Ancef [cefazolin]   Review of Systems Review of Systems  Constitutional: Negative for chills and fever.  HENT: Positive for ear pain, postnasal drip and sore throat. Negative for ear discharge, facial swelling, sinus pain, sinus pressure and trouble swallowing.   Eyes: Negative for pain and visual disturbance.  Respiratory: Negative for cough, choking, shortness of breath, wheezing and stridor.   Cardiovascular: Negative for chest pain and palpitations.  Gastrointestinal: Negative for abdominal distention, abdominal pain, diarrhea, nausea and vomiting.  Genitourinary: Negative for dysuria and hematuria.  Musculoskeletal: Negative for arthralgias, back pain, myalgias and neck stiffness.  Skin: Negative for color change, pallor and rash.  Neurological: Positive for headaches.     Physical Exam Updated Vital Signs BP 107/73 (BP Location: Left Arm)   Pulse 80   Temp 99 F (37.2 C) (Oral)   Resp 18   Ht 5' (1.524 m)   Wt 65.8 kg   LMP 09/10/2016  SpO2 100%   BMI 28.32 kg/m   Physical Exam  Constitutional: She appears well-developed and well-nourished. No distress.  Afebrile, nontoxic-appearing, sitting comfortably in bed in no acute distress.  HENT:  Head: Normocephalic and atraumatic.  Right Ear: External ear normal.  Left Ear: External ear normal.  Mouth/Throat: Oropharynx is clear and moist. No oropharyngeal exudate.  Tympanic membranes pearly gray visualized bony landmarks bilaterally. No erythema or bulging  Eyes: Conjunctivae and EOM are normal. Right eye exhibits no discharge. Left eye  exhibits no discharge.  Neck: Normal range of motion. Neck supple.  Cardiovascular: Normal rate, regular rhythm and normal heart sounds.   No murmur heard. Pulmonary/Chest: Effort normal and breath sounds normal. No respiratory distress. She has no wheezes. She has no rales. She exhibits no tenderness.  Abdominal: She exhibits no distension.  Musculoskeletal: Normal range of motion. She exhibits no edema.  Lymphadenopathy:    She has cervical adenopathy.  Neurological: She is alert.  Skin: Skin is warm and dry. No rash noted. She is not diaphoretic. No erythema. No pallor.  Psychiatric: She has a normal mood and affect.  Nursing note and vitals reviewed.    ED Treatments / Results  Labs (all labs ordered are listed, but only abnormal results are displayed) Labs Reviewed - No data to display  EKG  EKG Interpretation None       Radiology No results found.  Procedures Procedures (including critical care time)  Medications Ordered in ED Medications - No data to display   Initial Impression / Assessment and Plan / ED Course  I have reviewed the triage vital signs and the nursing notes.  Pertinent labs & imaging results that were available during my care of the patient were reviewed by me and considered in my medical decision making (see chart for details).    Patient presents with seasonal allergy symptoms and new onset of coughing 4 days ago. She reports that associated symptoms are caused by her coughing, chest tightness and headache and sore throat. No fever or chills myalgias or other symptoms.  Reassuring exam, lungs are clear and equal bilaterally to auscultation. Oropharynx is clear no exudate and tympanic membranes are normal bilaterally. Patient is nontoxic-appearing and afebrile.  Advised patient to remain on antihistamine daily, use humidifiers and nasal sprays. Discharge home with symptomatic relief and follow up with PCP.  Discussed strict return precautions  and advised to return to the emergency department if experiencing any new or worsening symptoms. Instructions were understood and patient agreed with discharge plan.  Final Clinical Impressions(s) / ED Diagnoses   Final diagnoses:  Cough  Seasonal allergic rhinitis, unspecified trigger    New Prescriptions New Prescriptions   BENZONATATE (TESSALON) 100 MG CAPSULE    Take 1 capsule (100 mg total) by mouth every 8 (eight) hours.   CETIRIZINE (ZYRTEC ALLERGY) 10 MG TABLET    Take 1 tablet (10 mg total) by mouth daily.   FLUTICASONE (FLONASE) 50 MCG/ACT NASAL SPRAY    Place 1 spray into both nostrils daily.     Georgiana ShoreMitchell, Jessica B, PA-C 09/26/16 0934    Melene PlanFloyd, Dan, DO 09/26/16 [phone]

## 2016-09-26 NOTE — Discharge Instructions (Signed)
As discussed,drink plenty of fluids to stay well hydrated and keep your urine clear. Get some rest. Use zyrtec daily, warm shower steam and/or humidifier. Follow up with your primary care provider. Return to the emergency department if developing fever, cough gets productive and worse, chest pain, difficulty breathing, or any other new concerning symptoms in the meantime.

## 2016-09-26 NOTE — ED Triage Notes (Signed)
Pt has nasal drainage, congestion, sore throat, non-productive cough which started two weeks ago but has progressed symptoms since onset.  Decreased appetite and headache due to cough.

## 2017-12-03 IMAGING — CR DG CHEST 2V
2 series · 2 of 2 positions shown · non-contrast
Comparison: 05/02/2013

CLINICAL DATA: Chest tightness with cough and shortness of breath

EXAM:
CHEST  2 VIEW

[w chest pa]
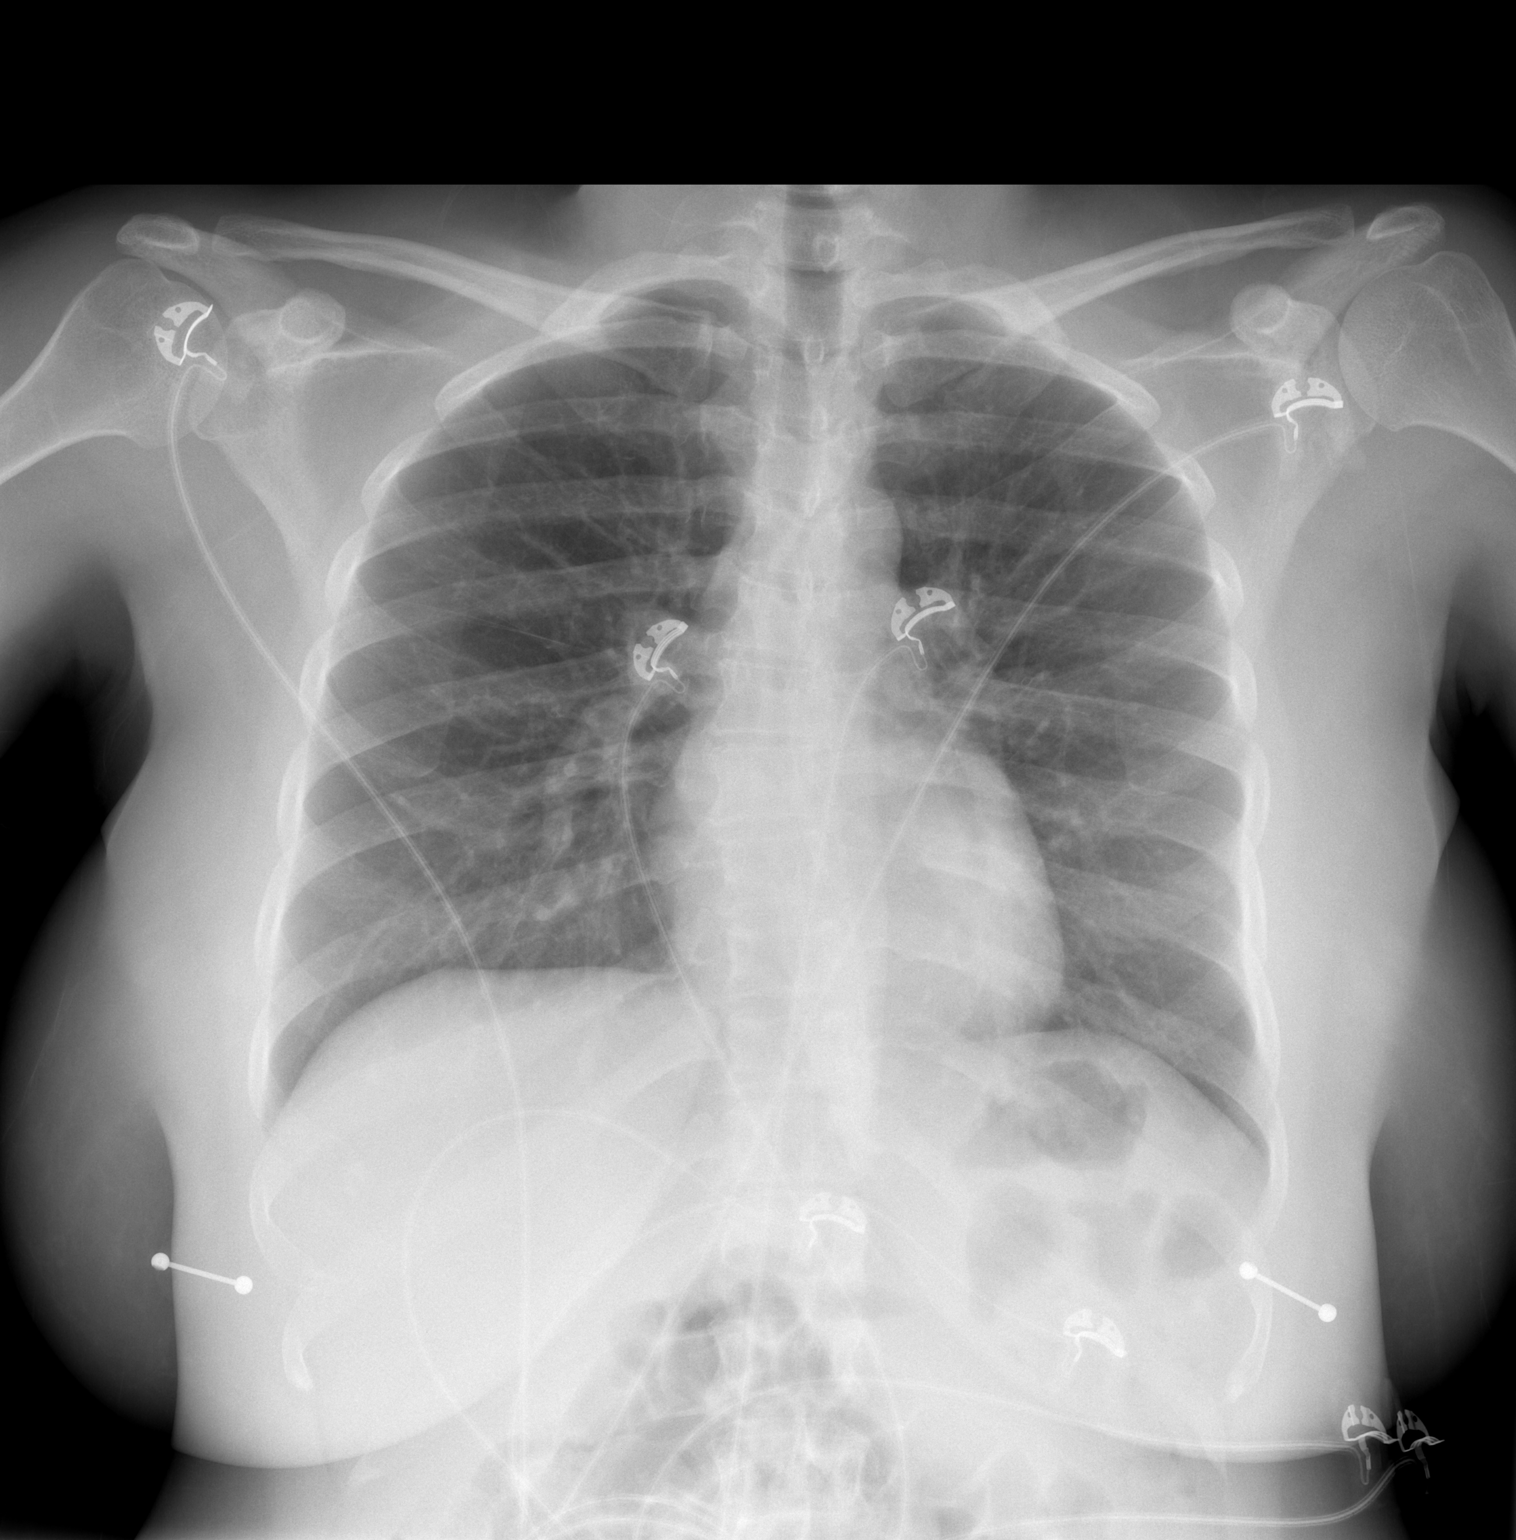

[w chest lat]
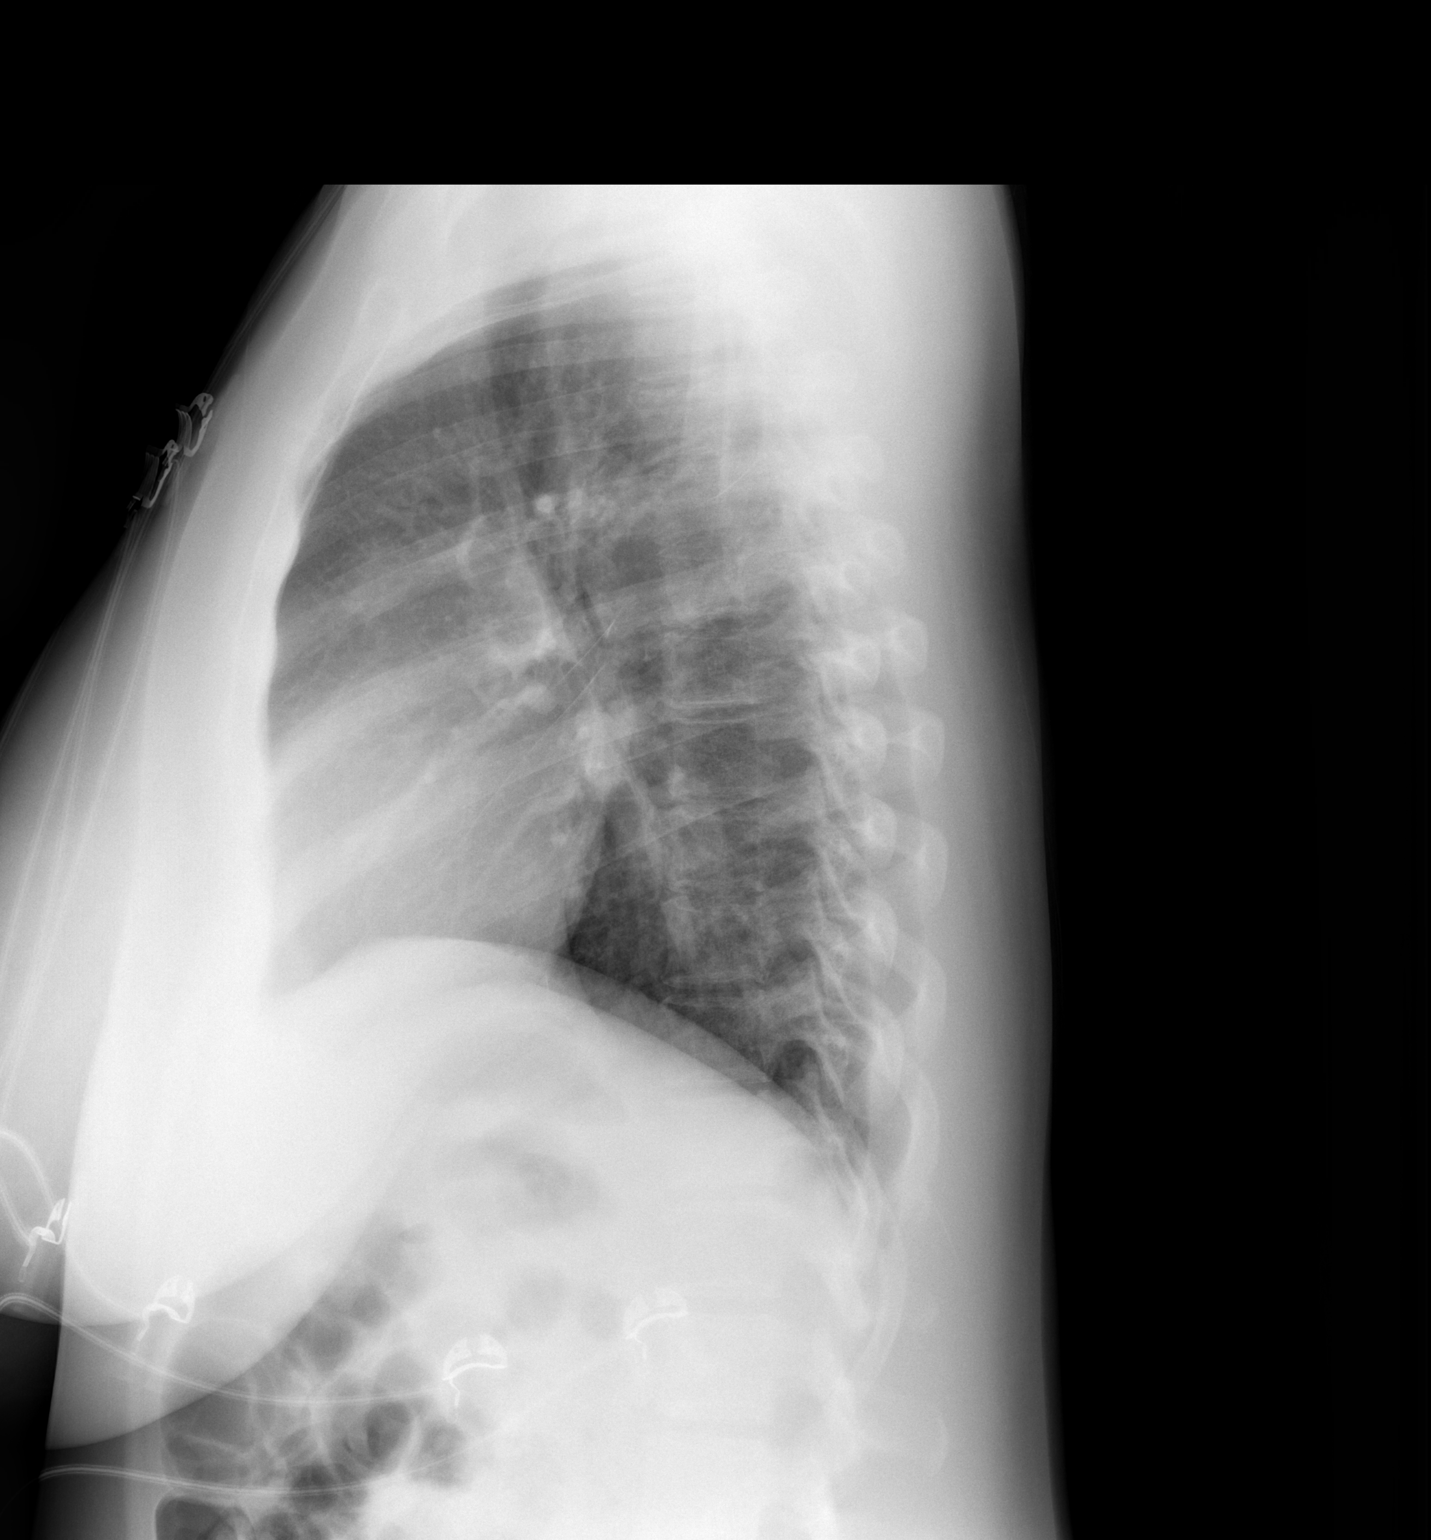

[2 of 2 positions shown; findings below may reference images not displayed]

FINDINGS: The heart size and mediastinal contours are within normal limits.
Both lungs are clear. The visualized skeletal structures are
unremarkable. Bilateral nipple rings.
IMPRESSION: No active cardiopulmonary disease.

## 2019-11-23 ENCOUNTER — Ambulatory Visit (INDEPENDENT_AMBULATORY_CARE_PROVIDER_SITE_OTHER): Payer: 59

## 2019-11-23 ENCOUNTER — Ambulatory Visit
Admission: RE | Admit: 2019-11-23 | Discharge: 2019-11-23 | Disposition: A | Discharge status: Home / Self Care | Payer: 59 | Source: Ambulatory Visit | Attending: Physician Assistant | Admitting: Physician Assistant

## 2019-11-23 ENCOUNTER — Other Ambulatory Visit: Payer: Self-pay

## 2019-11-23 VITALS — BP 121/83 | HR 83 | Temp 98.8°F | Resp 16

## 2019-11-23 DIAGNOSIS — R0781 Pleurodynia: Secondary | ICD-10-CM

## 2019-11-23 DIAGNOSIS — M546 Pain in thoracic spine: Secondary | ICD-10-CM

## 2019-11-23 MED ORDER — TIZANIDINE HCL 2 MG PO TABS: 2.0000 mg | ORAL_TABLET | Freq: Three times a day (TID) | ORAL | 0 refills | Status: DC | PRN

## 2019-11-23 MED ORDER — MELOXICAM 7.5 MG PO TABS: 7.5000 mg | ORAL_TABLET | Freq: Every day | ORAL | 0 refills | Status: DC
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
## 2020-06-29 ENCOUNTER — Encounter (HOSPITAL_BASED_OUTPATIENT_CLINIC_OR_DEPARTMENT_OTHER): Payer: Self-pay | Admitting: *Deleted

## 2020-06-29 ENCOUNTER — Emergency Department (HOSPITAL_BASED_OUTPATIENT_CLINIC_OR_DEPARTMENT_OTHER)
Admission: EM | Admit: 2020-06-29 | Discharge: 2020-06-29 | Disposition: A | Discharge status: Home / Self Care | Payer: 59 | Attending: Emergency Medicine | Admitting: Emergency Medicine

## 2020-06-29 ENCOUNTER — Other Ambulatory Visit: Payer: Self-pay

## 2020-06-29 DIAGNOSIS — F17210 Nicotine dependence, cigarettes, uncomplicated: Secondary | ICD-10-CM | POA: Insufficient documentation

## 2020-06-29 DIAGNOSIS — M5442 Lumbago with sciatica, left side: Secondary | ICD-10-CM

## 2020-06-29 DIAGNOSIS — Z79899 Other long term (current) drug therapy: Secondary | ICD-10-CM | POA: Insufficient documentation

## 2020-06-29 DIAGNOSIS — Y92410 Unspecified street and highway as the place of occurrence of the external cause: Secondary | ICD-10-CM | POA: Insufficient documentation

## 2020-06-29 MED ORDER — METHOCARBAMOL 500 MG PO TABS: 500.0000 mg | ORAL_TABLET | Freq: Two times a day (BID) | ORAL | 0 refills | Status: DC

## 2020-06-29 MED ORDER — NAPROXEN 375 MG PO TABS: 375.0000 mg | ORAL_TABLET | Freq: Two times a day (BID) | ORAL | 0 refills | Status: DC
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
## 2020-06-29 NOTE — Discharge Instructions (Signed)
Please refer to attached instructions
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
## 2020-07-24 ENCOUNTER — Other Ambulatory Visit: Payer: Self-pay

## 2020-07-24 ENCOUNTER — Emergency Department (HOSPITAL_BASED_OUTPATIENT_CLINIC_OR_DEPARTMENT_OTHER): Payer: Self-pay

## 2020-07-24 ENCOUNTER — Encounter (HOSPITAL_BASED_OUTPATIENT_CLINIC_OR_DEPARTMENT_OTHER): Payer: Self-pay | Admitting: Emergency Medicine

## 2020-07-24 ENCOUNTER — Emergency Department (HOSPITAL_BASED_OUTPATIENT_CLINIC_OR_DEPARTMENT_OTHER)
Admission: EM | Admit: 2020-07-24 | Discharge: 2020-07-24 | Disposition: A | Discharge status: Home / Self Care | Payer: Self-pay | Attending: Emergency Medicine | Admitting: Emergency Medicine

## 2020-07-24 DIAGNOSIS — N938 Other specified abnormal uterine and vaginal bleeding: Secondary | ICD-10-CM | POA: Insufficient documentation

## 2020-07-24 DIAGNOSIS — R102 Pelvic and perineal pain: Secondary | ICD-10-CM | POA: Insufficient documentation

## 2020-07-24 DIAGNOSIS — F17210 Nicotine dependence, cigarettes, uncomplicated: Secondary | ICD-10-CM | POA: Insufficient documentation

## 2020-07-24 DIAGNOSIS — R1031 Right lower quadrant pain: Secondary | ICD-10-CM | POA: Insufficient documentation

## 2020-07-24 DIAGNOSIS — N939 Abnormal uterine and vaginal bleeding, unspecified: Secondary | ICD-10-CM

## 2020-07-24 LAB — URINALYSIS, ROUTINE W REFLEX MICROSCOPIC
Bilirubin Urine: NEGATIVE
Glucose, UA: NEGATIVE mg/dL
Ketones, ur: NEGATIVE mg/dL
Leukocytes,Ua: NEGATIVE
Nitrite: NEGATIVE
Protein, ur: NEGATIVE mg/dL
Specific Gravity, Urine: 1.025 (ref 1.005–1.030)
pH: 6 (ref 5.0–8.0)

## 2020-07-24 LAB — CBC WITH DIFFERENTIAL/PLATELET
Abs Immature Granulocytes: 0.01 10*3/uL (ref 0.00–0.07)
Basophils Absolute: 0 10*3/uL (ref 0.0–0.1)
Basophils Relative: 0 %
Eosinophils Absolute: 0 10*3/uL (ref 0.0–0.5)
Eosinophils Relative: 1 %
HCT: 40.5 % (ref 36.0–46.0)
Hemoglobin: 13 g/dL (ref 12.0–15.0)
Immature Granulocytes: 0 %
Lymphocytes Relative: 41 %
Lymphs Abs: 1.5 10*3/uL (ref 0.7–4.0)
MCH: 29 pg (ref 26.0–34.0)
MCHC: 32.1 g/dL (ref 30.0–36.0)
MCV: 90.2 fL (ref 80.0–100.0)
Monocytes Absolute: 0.3 10*3/uL (ref 0.1–1.0)
Monocytes Relative: 8 %
Neutro Abs: 1.9 10*3/uL (ref 1.7–7.7)
Neutrophils Relative %: 50 %
Platelets: 195 10*3/uL (ref 150–400)
RBC: 4.49 MIL/uL (ref 3.87–5.11)
RDW: 14.1 % (ref 11.5–15.5)
WBC: 3.8 10*3/uL — ABNORMAL LOW (ref 4.0–10.5)
nRBC: 0 % (ref 0.0–0.2)

## 2020-07-24 LAB — URINALYSIS, MICROSCOPIC (REFLEX)

## 2020-07-24 LAB — BASIC METABOLIC PANEL
Anion gap: 10 (ref 5–15)
BUN: 10 mg/dL (ref 6–20)
CO2: 25 mmol/L (ref 22–32)
Calcium: 9 mg/dL (ref 8.9–10.3)
Chloride: 104 mmol/L (ref 98–111)
Creatinine, Ser: 0.82 mg/dL (ref 0.44–1.00)
GFR, Estimated: >60 mL/min (ref >60)
Glucose, Bld: 100 mg/dL — ABNORMAL HIGH (ref 70–99)
Potassium: 3.8 mmol/L (ref 3.5–5.1)
Sodium: 139 mmol/L (ref 135–145)

## 2020-07-24 LAB — WET PREP, GENITAL
Clue Cells Wet Prep HPF POC: NONE SEEN
Sperm: NONE SEEN
Trich, Wet Prep: NONE SEEN
Yeast Wet Prep HPF POC: NONE SEEN

## 2020-07-24 LAB — PREGNANCY, URINE: Preg Test, Ur: NEGATIVE

## 2020-07-24 MED ORDER — IBUPROFEN 800 MG PO TABS
800.0000 mg | ORAL_TABLET | Freq: Once | ORAL | Status: AC
  Administered 2020-07-24: 800 mg via ORAL
  Filled 2020-07-24: qty 1

## 2020-07-24 MED ORDER — OXYCODONE-ACETAMINOPHEN 5-325 MG PO TABS
1.0000 | ORAL_TABLET | Freq: Once | ORAL | Status: AC
  Administered 2020-07-24: 1 via ORAL
  Filled 2020-07-24: qty 1

## 2020-07-24 MED ORDER — OXYCODONE-ACETAMINOPHEN 5-325 MG PO TABS: 1.0000 | ORAL_TABLET | ORAL | 0 refills | Status: DC | PRN

## 2020-07-24 NOTE — Discharge Instructions (Signed)
You were seen in the emergency department today for your pelvic pain and vaginal bleeding.  I suspect you are likely on your menstrual period.  Your physical exam, vital signs, blood work, urine test, and pelvic ultrasound were very reassuring.  You may continue to use ibuprofen or Tylenol, you also may apply heat to the area and return to the emergency department if you develop any severe abdominal pain, nausea vomiting does not stop, if you pass out, or develop any other new severe symptoms.

## 2020-07-24 NOTE — ED Provider Notes (Signed)
[facility] EMERGENCY DEPARTMENT Provider Note   CSN: [id number] Arrival date & time: 07/24/20  4235     History Chief Complaint  Patient presents with  . Abdominal Pain    Sabrina Owens is a 35 y.o. female who presents with concern for 3 days of bilateral lower abdominal/pelvic pain, L>R, with 1 day of associated light vaginal bleeding. LMP was 05/05/2020. Patient has history of irregular menstrual cycles and history of tubal ligation in the past though she is sexually active in a monogamous relationship with a female partner. She denies any new vaginal discharge or discomfort, denies any skin changes in her genitals. She states that she is not bleeding through pads, however is passing blood clots intermittently. She states she had a light positive pregnancy test at home in early January, and has not tested again since that time. She has not sought medical attention for her missed menstrual cycle prior to today.  I personally reviewed the patient's medical records. She is history of preeclampsia and irregular menstrual bleeding. She is not any medications every day.  HPI     Past Medical History:  Diagnosis Date  . Heart palpitations   . Irregular menstrual bleeding   . Preeclampsia   . Renal disorder     Patient Active Problem List   Diagnosis Date Noted  . Hepatitis C antibody test positive 10/11/2014  . Leukocytopenia 09/27/2014  . Thrombocytopenia (HCC) 09/27/2014    Past Surgical History:  Procedure Laterality Date  . CESAREAN SECTION    . ENDOMETRIAL ABLATION    . SOFT TISSUE CYST EXCISION    . TUBAL LIGATION    . WISDOM TOOTH EXTRACTION       OB History   No obstetric history on file.     No family history on file.  Social History   Tobacco Use  . Smoking status: Current Some Day Smoker    Types: Cigarettes  . Smokeless tobacco: Never Used  Vaping Use  . Vaping Use: Never used  Substance Use Topics  . Alcohol use: Yes    Comment: occ  .  Drug use: Yes    Types: Marijuana    Comment: denies 05/02/16    Home Medications Prior to Admission medications   Medication Sig Start Date End Date Taking? Authorizing Provider  oxyCODONE-acetaminophen (PERCOCET/ROXICET) 5-325 MG tablet Take 1 tablet by mouth every 4 (four) hours as needed for severe pain. 07/24/20  Yes Sponseller, Rebekah R, PA-C  albuterol (PROVENTIL HFA;VENTOLIN HFA) 108 (90 BASE) MCG/ACT inhaler Inhale 2 puffs into the lungs every 2 (two) hours as needed for wheezing or shortness of breath (cough). 05/02/13   [name], MD  meloxicam (MOBIC) 7.5 MG tablet Take 1 tablet (7.5 mg total) by mouth daily. 11/23/19   Cathie Hoops, Amy V, PA-C  methocarbamol (ROBAXIN) 500 MG tablet Take 1 tablet (500 mg total) by mouth 2 (two) times daily. 06/29/20   Felicie Morn, NP  naproxen (NAPROSYN) 375 MG tablet Take 1 tablet (375 mg total) by mouth 2 (two) times daily. 06/29/20   Felicie Morn, NP  tiZANidine (ZANAFLEX) 2 MG tablet Take 1 tablet (2 mg total) by mouth every 8 (eight) hours as needed for muscle spasms. 11/23/19   Cathie Hoops, Amy V, PA-C  cetirizine (ZYRTEC ALLERGY) 10 MG tablet Take 1 tablet (10 mg total) by mouth daily. 09/26/16 11/23/19  Mathews Robinsons B, PA-C  fluticasone (FLONASE) 50 MCG/ACT nasal spray Place 1 spray into both nostrils daily. 09/26/16 11/23/19  Georgiana Shore, PA-C    Allergies    Ancef [cefazolin]  Review of Systems   Review of Systems  Constitutional: Negative.   HENT: Negative.   Respiratory: Negative.   Cardiovascular: Negative.   Gastrointestinal: Negative.   Genitourinary: Positive for menstrual problem, pelvic pain and vaginal bleeding. Negative for decreased urine volume, difficulty urinating, dyspareunia, dysuria, flank pain, frequency, hematuria, urgency, vaginal discharge and vaginal pain.  Musculoskeletal: Positive for back pain.  Skin: Negative.   Neurological: Negative.  Negative for syncope.  Hematological: Does not bruise/bleed easily.    Physical  Exam Updated Vital Signs BP 122/88 (BP Location: Left Arm)   Pulse 69   Temp 99.3 F (37.4 C) (Oral)   Resp 14   Ht 5' (1.524 m)   Wt 69.4 kg   LMP 05/05/2020   SpO2 100%   BMI 29.88 kg/m   Physical Exam Vitals and nursing note reviewed. Exam conducted with a chaperone present.  Constitutional:      Appearance: Normal appearance. She is not ill-appearing.  HENT:     Head: Normocephalic and atraumatic.     Nose: Nose normal.     Mouth/Throat:     Mouth: Mucous membranes are moist.     Pharynx: Oropharynx is clear. Uvula midline. No oropharyngeal exudate, posterior oropharyngeal erythema or uvula swelling.     Tonsils: No tonsillar exudate.  Eyes:     General: Lids are normal. Vision grossly intact.        Right eye: No discharge.        Left eye: No discharge.     Extraocular Movements: Extraocular movements intact.     Conjunctiva/sclera: Conjunctivae normal.     Pupils: Pupils are equal, round, and reactive to light.  Neck:     Trachea: Trachea and phonation normal.  Cardiovascular:     Rate and Rhythm: Normal rate and regular rhythm.     Pulses: Normal pulses.     Heart sounds: Normal heart sounds. No murmur heard.   Pulmonary:     Effort: Pulmonary effort is normal. No tachypnea or respiratory distress.     Breath sounds: Normal breath sounds. No wheezing or rales.  Chest:     Chest wall: No deformity, swelling, tenderness, crepitus or edema.  Abdominal:     General: Bowel sounds are normal. There is no distension.     Palpations: Abdomen is soft.     Tenderness: There is abdominal tenderness in the right lower quadrant, suprapubic area and left lower quadrant. There is no right CVA tenderness, left CVA tenderness, guarding or rebound. Negative signs include Murphy's sign, Rovsing's sign and McBurney's sign.  Genitourinary:    General: Normal vulva.     Exam position: Lithotomy position.     Vagina: Normal.     Cervix: Normal. No eversion.     Uterus: Normal.       Adnexa: Right adnexa normal.       Left: Tenderness present. No mass or fullness.       Comments: Blood in the vaginal vault, with small blood clots present Musculoskeletal:        General: No deformity.     Cervical back: Neck supple. No rigidity or crepitus. No pain with movement or spinous process tenderness.     Right lower leg: No edema.     Left lower leg: No edema.  Lymphadenopathy:     Cervical: No cervical adenopathy.  Skin:    General: Skin is warm and  dry.     Capillary Refill: Capillary refill takes less than 2 seconds.  Neurological:     General: No focal deficit present.     Mental Status: She is alert and oriented to person, place, and time. Mental status is at baseline.     Sensory: Sensation is intact.     Motor: Motor function is intact.     Gait: Gait is intact.  Psychiatric:        Mood and Affect: Mood normal.     ED Results / Procedures / Treatments   Labs (all labs ordered are listed, but only abnormal results are displayed) Labs Reviewed  WET PREP, GENITAL - Abnormal; Notable for the following components:      Result Value   WBC, Wet Prep HPF POC FEW (*)    All other components within normal limits  BASIC METABOLIC PANEL - Abnormal; Notable for the following components:   Glucose, Bld 100 (*)    All other components within normal limits  CBC WITH DIFFERENTIAL/PLATELET - Abnormal; Notable for the following components:   WBC 3.8 (*)    All other components within normal limits  URINALYSIS, ROUTINE W REFLEX MICROSCOPIC - Abnormal; Notable for the following components:   APPearance CLOUDY (*)    Hgb urine dipstick LARGE (*)    All other components within normal limits  URINALYSIS, MICROSCOPIC (REFLEX) - Abnormal; Notable for the following components:   Bacteria, UA FEW (*)    All other components within normal limits  PREGNANCY, URINE  GC/CHLAMYDIA PROBE AMP ([place]    EKG None  Radiology US PELVIC COMPLETE W TRANSVAGINAL  AND TORSION R/O  Result Date: 07/24/2020 CLINICAL DATA:  35 year old female with pelvic pain and vaginal bleeding for 4 days. EXAM: TRANSABDOMINAL AND TRANSVAGINAL ULTRASOUND OF PELVIS DOPPLER ULTRASOUND OF OVARIES TECHNIQUE: Both transabdominal and transvaginal ultrasound examinations of the pelvis were performed. Transabdominal technique was performed for global imaging of the pelvis including uterus, ovaries, adnexal regions, and pelvic cul-de-sac. It was necessary to proceed with endovaginal exam following the transabdominal exam to visualize the ovaries and endometrium. Color and duplex Doppler ultrasound was utilized to evaluate blood flow to the ovaries. COMPARISON:  09/18/2012 FINDINGS: Uterus Measurements: 9.9 x 5 x 5.6 cm = volume: 144 mL. No fibroids or other mass visualized. Nabothian cysts are present. Endometrium Thickness: 4 mm.  No focal abnormality visualized. Right ovary Measurements: 3.1 x 2 x 2.9 cm = volume: 9.4 mL. Normal appearance/no adnexal mass. Left ovary Measurements: 2.1 x 1.3 x 1.8 cm = volume: 2.6 mL. Normal appearance/no adnexal mass. Pulsed Doppler evaluation of both ovaries demonstrates normal low-resistance arterial and venous waveforms. Other findings A trace amount of free pelvic fluid is noted. IMPRESSION: 1. Unremarkable uterus and ovaries.  No evidence of ovarian torsion. 2. Trace free pelvic fluid, may be physiologic. Electronically Signed   By: Harmon PierJeffrey  Hu M.D.   On: 07/24/2020 13:49    Procedures Procedures   Medications Ordered in ED Medications  ibuprofen (ADVIL) tablet 800 mg (800 mg Oral Given 07/24/20 1106)  oxyCODONE-acetaminophen (PERCOCET/ROXICET) 5-325 MG per tablet 1 tablet (1 tablet Oral Given 07/24/20 1255)    ED Course  I have reviewed the triage vital signs and the nursing notes.  Pertinent labs & imaging results that were available during my care of the patient were reviewed by me and considered in my medical decision making (see chart for  details).    MDM Rules/Calculators/A&P  35 year old female presents with concern for 1 day of vaginal bleeding with associated 3 days of bilateral pelvic pain. LMP 05/05/2020.  Differential diagnosis for this patient symptoms includes but is not limited to menstrual bleeding, abnormal uterine bleeding, implantation bleeding, spontaneous abortion, ectopic pregnancy, Sexually transmitted infection, endometriosis, PID, vaginal trauma, neoplasia.  Vitals are normal intake. Cardiopulmonary exam is normal, abdominal exam significant for bilateral lower quadrant tenderness palpation suprapubic tenderness to palpation, L>R. GU exam with left adnexal TTP, and blood with small clots in the vaginal vault.  Will proceed with basic laboratory studies and urine test, as well as pelvic US.   CBC unremarkable, BMP unremarkable, UA with few bacteria but large amount of hemoglobin. Wet prep unremarkable. GC/chlamydia pending. Pelvic ultrasound  without acute abnormality.  Given reassuring physical exam, vital signs, laboratory, and imaging studies, no further work-up is warranted in ED at this time. Suspect patient symptoms are secondary to irregular menstrual cycle, with current menstrual bleeding. Recommend she follow-up closely with OB/GYN. Will provide OB/GYN contact with patient currently is not established.  Asalee voiced understanding of her medical evaluation and treatment plan. Each of her questions was answered to her expressed satisfaction. Return cautions given. Patient is well-appearing, stable, and appropriate for discharge at this time.  This chart was dictated using voice recognition software, Dragon. Despite the best efforts of this provider to proofread and correct errors, errors may still occur which can change documentation meaning.  Final Clinical Impression(s) / ED Diagnoses Final diagnoses:  Pelvic pain  Vaginal bleeding    Rx / DC Orders ED Discharge Orders          Ordered    oxyCODONE-acetaminophen (PERCOCET/ROXICET) 5-325 MG tablet  Every 4 hours PRN        07/24/20 1511           Sponseller, Idelia Salm 07/24/20 2033    Benjiman Core, MD 07/25/20 [phone]

## 2020-07-24 NOTE — ED Notes (Signed)
Patient transported to US 

## 2020-07-24 NOTE — ED Triage Notes (Addendum)
Pt reports have not had her menstrual period since 05/05/2020. Pt began having vaginal bleeding with clots and using 2 pads in an hour. Pt reports abdominal pain on Thursday and Friday. Pt did not seek care for missed menstrual cycle. Pt took two home pregnancy test one was light and the other was negative.

## 2020-07-24 NOTE — ED Notes (Addendum)
ED Provider at bedside for pelvic exam.  

## 2020-07-24 NOTE — ED Notes (Signed)
Pt changed to gown, IV placed, pt tolerated well.

## 2020-07-25 LAB — GC/CHLAMYDIA PROBE AMP (~~LOC~~) NOT AT ARMC
Chlamydia: NEGATIVE
Comment: NEGATIVE
Comment: NORMAL
Neisseria Gonorrhea: NEGATIVE

## 2021-12-25 ENCOUNTER — Ambulatory Visit (INDEPENDENT_AMBULATORY_CARE_PROVIDER_SITE_OTHER): Payer: 59 | Admitting: Internal Medicine

## 2021-12-25 ENCOUNTER — Encounter: Payer: Self-pay | Admitting: Internal Medicine

## 2021-12-25 VITALS — BP 133/80 | HR 75 | Temp 98.0°F | Ht 61.0 in | Wt 138.5 lb

## 2021-12-25 DIAGNOSIS — F418 Other specified anxiety disorders: Secondary | ICD-10-CM | POA: Diagnosis not present

## 2021-12-25 DIAGNOSIS — F419 Anxiety disorder, unspecified: Secondary | ICD-10-CM

## 2021-12-25 MED ORDER — HYDROXYZINE HCL 10 MG PO TABS
10.0000 mg | ORAL_TABLET | Freq: Every evening | ORAL | 0 refills | Status: DC | PRN
Start: 2021-12-25 — End: 2022-01-08

## 2021-12-25 MED ORDER — CITALOPRAM HYDROBROMIDE 20 MG PO TABS: 20.0000 mg | ORAL_TABLET | Freq: Every day | ORAL | 2 refills | Status: DC

## 2021-12-25 NOTE — Progress Notes (Unsigned)
   CC: anxiety  HPI:  Sabrina Owens is a 36 y.o. female with past medical history as detailed below who presents today with anxiety. Please see problem based charting for detailed assessment and plan.  Patient acutely distressed due to anxiety limiting full history taking. She has a history of PCOS and thinks diabetes may run in the family. She will follow up in 1-2 weeks at which time full medical, family, social history will be obtained and documented.  Past Medical History:  Diagnosis Date   Heart palpitations    Irregular menstrual bleeding    PCOS (polycystic ovarian syndrome)    Preeclampsia    Renal disorder    Review of Systems:  Negative unless otherwise stated.  Physical Exam:  Vitals:   12/25/21 1539  BP: 133/80  Pulse: 75  Temp: 98 F (36.7 C)  TempSrc: Oral  SpO2: 100%  Weight: 138 lb 8 oz (62.8 kg)  Height: 5\' 1"  (1.549 m)   Constitutional:Tearful, anxious-appearing. Cardio:Regular rate and rhythm. No murmurs, rubs, or gallops. Pulm:Clear to auscultation bilaterally. for extremity edema. Skin:Warm and dry. Neuro:Alert and oriented x3. No focal deficit noted. Psych:Tearful mood with anxious affect. Denies SI/HI.  Assessment & Plan:   See Encounters Tab for problem based charting.  Anxiety with depression Sabrina Owens presents today with concerns of severe anxiety and depresstion. She was treated several years ago for anxiety with Prozac but ultimately weaned herself off of medication. Today her PHQ-9 22, GAD-7 20. She explains that she is a single mom with three children, one of whom is soon to go off to college. Currently she feels that her head is overwhelmed and like everything around her is out of control. She is having separation anxiety for her oldest who is going to college and is also stressed by trying to figure out if she will commute or pay a large amount of money for housing that she does not have. She has also recently learned that  her current housing situation is uncertain. 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 Further complicating things there was a death in the family 2 weeks ago. She works two full time jobs from home to support her family.  She experiences panic attacks ~1 time weekly with sudden diaphoresis, palpitations, shortness of breath, and tunnel vision. She did not have these when she experienced anxiety when she was younger. She goes to a dark room alone to calm her mind during these episodes.  She has trouble sleeping and takes tylenol PM nearly every evening to help her sleep. She smokes marijuana and denies alcohol or other substance use. She denies SI/HI, stating she loves her children too much to leave them. She has not talked to a counselor or therapist. Plan:Start Celexa 20 mg daily and hydroxyzine 10 mg at bedtime. Check TSH for organic cause of anxiety. RTC for f/u in 2 weeks.   Patient discussed with Dr. Marina Goodell

## 2021-12-25 NOTE — Patient Instructions (Signed)
Ms. Dixson,  I am sorry that you are experiencing such anxiety. I would like to start you on a few medications that I am hopeful will help you!  These are: Citalopram: 20 mg once daily Hydroxyzine: 10 mg at bedtime for anxiety  I am also checking thyroid labs to make sure your thyroid is not working abnormally and leading to your symptoms.  You can also search [email or web address] for a therapist if that is something you are interested in.   Please return to clinic in 2 weeks so I can see how you are doing. If you need [place] before then, don't hesitate to call!  My best, Dr. August Saucer

## 2021-12-26 ENCOUNTER — Encounter: Payer: Self-pay | Admitting: Internal Medicine

## 2021-12-26 DIAGNOSIS — F418 Other specified anxiety disorders: Secondary | ICD-10-CM | POA: Insufficient documentation

## 2021-12-26 NOTE — Assessment & Plan Note (Signed)
Sabrina Owens presents today with concerns of severe anxiety and depresstion. She was treated several years ago for anxiety with Prozac but ultimately weaned herself off of medication. Today her PHQ-9 22, GAD-7 20. She explains that she is a single mom with three children, one of whom is soon to go off to college. Currently she feels that her head is overwhelmed and like everything around her is out of control. She is having separation anxiety for her oldest who is going to college and is also stressed by trying to figure out if she will commute or pay a large amount of money for housing that she does not have. She has also recently learned that her current housing situation is uncertain. 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 Further complicating things there was a death in the family 2 weeks ago. She works two full time jobs from home to support her family.  She experiences panic attacks ~1 time weekly with sudden diaphoresis, palpitations, shortness of breath, and tunnel vision. She did not have these when she experienced anxiety when she was younger. She goes to a dark room alone to calm her mind during these episodes.  She has trouble sleeping and takes tylenol PM nearly every evening to help her sleep. She smokes marijuana and denies alcohol or other substance use. She denies SI/HI, stating she loves her children too much to leave them. She has not talked to a counselor or therapist. Plan:Start Celexa 20 mg daily and hydroxyzine 10 mg at bedtime. Check TSH for organic cause of anxiety. RTC for f/u in 2 weeks.

## 2021-12-26 NOTE — Progress Notes (Signed)
Internal Medicine Clinic Attending ? ?Case discussed with Dr. Dean  At the time of the visit.  We reviewed the resident?s history and exam and pertinent patient test results.  I agree with the assessment, diagnosis, and plan of care documented in the resident?s note.  ?

## 2021-12-27 LAB — TSH: TSH: 1.09 u[IU]/mL (ref 0.450–4.500)

## 2021-12-27 NOTE — Addendum Note (Signed)
Addended by: Ihor Dow on: 12/27/2021 02:09 PM   Modules accepted: Level of Service

## 2021-12-29 ENCOUNTER — Encounter: Payer: Self-pay | Admitting: Internal Medicine

## 2022-01-08 ENCOUNTER — Ambulatory Visit (INDEPENDENT_AMBULATORY_CARE_PROVIDER_SITE_OTHER): Payer: 59 | Admitting: Internal Medicine

## 2022-01-08 ENCOUNTER — Encounter: Payer: Self-pay | Admitting: Internal Medicine

## 2022-01-08 VITALS — BP 103/64 | HR 79 | Temp 98.2°F | Ht 61.0 in | Wt 137.4 lb

## 2022-01-08 DIAGNOSIS — Z0000 Encounter for general adult medical examination without abnormal findings: Secondary | ICD-10-CM | POA: Insufficient documentation

## 2022-01-08 DIAGNOSIS — Z23 Encounter for immunization: Secondary | ICD-10-CM

## 2022-01-08 DIAGNOSIS — F418 Other specified anxiety disorders: Secondary | ICD-10-CM

## 2022-01-08 MED ORDER — CITALOPRAM HYDROBROMIDE 20 MG PO TABS: 20.0000 mg | ORAL_TABLET | Freq: Every day | ORAL | 4 refills | Status: DC

## 2022-01-08 MED ORDER — HYDROXYZINE HCL 10 MG PO TABS: 10.0000 mg | ORAL_TABLET | Freq: Every evening | ORAL | 1 refills | Status: DC | PRN

## 2022-01-08 NOTE — Patient Instructions (Addendum)
Ms. Fetsch,  It was nice seeing you today! Thank you for choosing [facility] for your Primary Care.    Today we talked about:   Anxiety: I am so glad to hear that you are feeling better! I will keep your medicines the same and see you in around 6 months. If you notice that the medicine is not working well anymore before then please let us know so that we can see you sooner to make adjustments.  If you have any other health concerns prior to our next visit please contact the clinic to be seen.  My best, Dr. August Saucer

## 2022-01-08 NOTE — Progress Notes (Signed)
   CC: anxiety, new patient f/u  HPI:  [name] is a 36 y.o. person with past medical history as detailed below who presents today for anxiety and new patient follow-up. Please see problem based charting for detailed assessment and plan.  Past Medical History:  Diagnosis Date   Heart palpitations    Irregular menstrual bleeding    PCOS (polycystic ovarian syndrome)    Preeclampsia    Renal disorder    Review of Systems:  Negative unless otherwise stated.  Physical Exam:  Vitals:   01/08/22 1325  BP: 103/64  Pulse: 79  Temp: 98.2 F (36.8 C)  TempSrc: Oral  SpO2: 100%  Weight: 137 lb 6.4 oz (62.3 kg)  Height: 5\' 1"  (1.549 m)   Constitutional:Well appearing. In no acute distress. Cardio:Regular rate and rhythm. No murmurs, rubs, or gallops. Pulm:Clear to auscultation bilaterally. Normal work of breathing on room air. for extremity edema. Skin:Warm and dry. Neuro:Alert and oriented x3. No focal deficit noted. Psych:Pleasant mood and affect.  Assessment & Plan:   See Encounters Tab for problem based charting.  Anxiety with depression Patient presents for follow-up today and states that she can tell a definite difference since starting citalopram and hydroxyzine. She feels less overwhelmed in both her professional and personal life, is less tearful, less on-edge, and is able to sleep again. Plan:Continue citalopram 20 mg daily, hydroxyzine 10 mg daily as needed for sleep. RTC in around 6 months for follow-up or sooner if medications are not as efficacious prior to that time.  Healthcare maintenance Due for Tdap today. States last pap smear was last year. Plan:Tdap given today.  Patient discussed with Dr. GTX:MIWOEHOZ

## 2022-01-08 NOTE — Assessment & Plan Note (Signed)
Patient presents for follow-up today and states that she can tell a definite difference since starting citalopram and hydroxyzine. She feels less overwhelmed in both her professional and personal life, is less tearful, less on-edge, and is able to sleep again. Plan:Continue citalopram 20 mg daily, hydroxyzine 10 mg daily as needed for sleep. RTC in around 6 months for follow-up or sooner if medications are not as efficacious prior to that time.

## 2022-01-08 NOTE — Assessment & Plan Note (Signed)
Due for Tdap today. States last pap smear was last year. Plan:Tdap given today.

## 2022-01-11 NOTE — Progress Notes (Signed)
Internal Medicine Clinic Attending ? ?Case discussed with Dr. Dean  At the time of the visit.  We reviewed the resident?s history and exam and pertinent patient test results.  I agree with the assessment, diagnosis, and plan of care documented in the resident?s note.  ?

## 2022-01-31 ENCOUNTER — Telehealth: Payer: Self-pay | Admitting: *Deleted

## 2022-01-31 ENCOUNTER — Other Ambulatory Visit: Payer: Self-pay | Admitting: Internal Medicine

## 2022-01-31 NOTE — Telephone Encounter (Signed)
Call from patient stated husband tested positive for Covid on 01/16/2022.  She started feeling bad a few days later.  Did not do a Covid Test.  Had fevers last week of 102 plus.  Had started to feel better.   Patient stated today has a temperature of 99.8 Stuffy nose.  Runny at times non productive cough.  Has been using her inhaler more.  Wants to know what she can get at the drug store.  Was at work when she called . Marland Kitchen  Just feels bad.  Can be reached at [phone].

## 2022-02-01 ENCOUNTER — Ambulatory Visit (INDEPENDENT_AMBULATORY_CARE_PROVIDER_SITE_OTHER): Payer: 59 | Admitting: Internal Medicine

## 2022-02-01 DIAGNOSIS — U071 COVID-19: Secondary | ICD-10-CM

## 2022-02-01 NOTE — Telephone Encounter (Signed)
RTC to patient states still not feeling any better to day.   Given  TeleHealth appointment for this afternoon at 3:30 PM.

## 2022-02-01 NOTE — Progress Notes (Unsigned)
    I connected with  [name] on 02/02/22 by telephone and verified that I am speaking with the correct person using two identifiers.   I discussed the limitations of evaluation and management by telemedicine. The patient expressed understanding and agreed to proceed.  CC: cough, runny nose, headache  This is a telephone encounter between [employer] and [employer] on 02/02/2022 for continued symptoms from COVID infection that was diagnosed 2 weeks ago. The visit was conducted with the patient located at home and Rudene Christians at [facility]. The patient's identity was confirmed using their DOB and current address. The patient has consented to being evaluated through a telephone encounter and understands the associated risks (an examination cannot be done and the patient may need to come in for an appointment) / benefits (allows the patient to remain at home, decreasing exposure to coronavirus). I personally spent 10 minutes on medical discussion.   HPI:  [name] is a 36 y.o. with PMH as below.   Please see A&P for assessment of the patient's acute and chronic medical conditions.   Past Medical History:  Diagnosis Date   Heart palpitations    Irregular menstrual bleeding    PCOS (polycystic ovarian syndrome)    Preeclampsia    Renal disorder    Review of Systems:  denies fever, chills, nausea, vomiting, and diarrhea.  Assessment & Plan:   COVID Patient states that she and her husband were diagnosed with COVID about 2 weeks ago. Since then she continues to have runny nose, non productive cough, and feels more short of breath with ambulation. She has been using albuterol inhaler 1-2 times daily over the last 2 weeks with improvement in symptoms. She has completed all covid vaccinations. She does not have pulse ox at home. She has been using mucinex and drinking plenty of fluids. She feels improvement in symptoms from day prior. Exam limited due to telehealth visit.   Plan: I talked with patient about continuing supportive treatment. We also talked about trying a humidifier at night. Her shortness of breath feels improved from day prior. With limitation of exam, I encouraged her to go to urgent care or ED if she were to feel more short of breath.    Patient discussed with Dr. Dionne Bucy [name], D.O. [facility]  PGY-2 Pager: [phone]  Phone: [phone] Date 02/02/2022  Time 7:55 AM

## 2022-02-01 NOTE — Telephone Encounter (Signed)
Please schedule for TH visit.

## 2022-02-02 DIAGNOSIS — U071 COVID-19: Secondary | ICD-10-CM | POA: Insufficient documentation

## 2022-02-02 NOTE — Assessment & Plan Note (Addendum)
Patient states that she and her husband were diagnosed with COVID about 2 weeks ago. Since then she continues to have runny nose, non productive cough, and feels more short of breath with ambulation. She has been using albuterol inhaler 1-2 times daily over the last 2 weeks with improvement in symptoms. She has completed all covid vaccinations. She does not have pulse ox at home. She has been using mucinex and drinking plenty of fluids. She feels improvement in symptoms from day prior. Exam limited due to telehealth visit.  Plan: I talked with patient about continuing supportive treatment. We also talked about trying a humidifier at night. Her shortness of breath feels improved from day prior. With limitation of exam, I encouraged her to go to urgent care or ED if she were to feel more short of breath.

## 2022-02-16 NOTE — Progress Notes (Signed)
Internal Medicine Clinic Attending  Case discussed with Dr. Masters  At the time of the visit.  We reviewed the resident's history and exam and pertinent patient test results.  I agree with the assessment, diagnosis, and plan of care documented in the resident's note.  

## 2022-03-06 ENCOUNTER — Other Ambulatory Visit: Payer: Self-pay | Admitting: Internal Medicine

## 2022-04-10 ENCOUNTER — Other Ambulatory Visit: Payer: Self-pay | Admitting: Internal Medicine

## 2022-07-03 ENCOUNTER — Other Ambulatory Visit: Payer: Self-pay | Admitting: Internal Medicine

## 2022-08-01 ENCOUNTER — Ambulatory Visit (INDEPENDENT_AMBULATORY_CARE_PROVIDER_SITE_OTHER): Payer: 59 | Admitting: Student

## 2022-08-01 VITALS — BP 120/71 | HR 81 | Temp 97.9°F | Ht 61.0 in | Wt 158.2 lb

## 2022-08-01 DIAGNOSIS — F418 Other specified anxiety disorders: Secondary | ICD-10-CM

## 2022-08-01 DIAGNOSIS — R61 Generalized hyperhidrosis: Secondary | ICD-10-CM | POA: Insufficient documentation

## 2022-08-01 DIAGNOSIS — L749 Eccrine sweat disorder, unspecified: Secondary | ICD-10-CM | POA: Diagnosis not present

## 2022-08-01 MED ORDER — PROPRANOLOL HCL 10 MG PO TABS: ORAL_TABLET | ORAL | 2 refills | Status: DC

## 2022-08-01 NOTE — Assessment & Plan Note (Addendum)
Assessment: Sabrina Owens continues to have increased social anxiety. This has improved since increasing the celexa to twice in the morning and hydroxyzine 10-20 mg QHS. She notes her anxiety started after multiple life stressors including the passing of her aunt, her brother being diagnosed with throat cancer, and housing instability.   Since, she has had increased anxiety with any social situation. She endorses associated sweating requiring her to change clothes throughout her day, racing thoughts, and not wanting to go out in public. She denise any chest pain or palpitations. She denies having similar symptoms prior to these events. Other symptoms she has noticed are weight gain of 10-20 lbs (confirmed on chart review), periodic nausea and bilateral lower extremity swelling, mostly in her left leg. She has also noticed changes in her menstrual cycle with irregular bleeding. She notes her two pregnancies were complicated by hemorrhages.   She is interested in meeting with a counselor to speak with about these events. Will also add PRN propranolol to take before going out into anxiety provoking events. Heart rate without bradycardia.   While I do believe she has a component of anxiety, her symptoms warrant further work up to rule out any underlying metabolic disturbance or endocrinopathy.. She had a normal TSH in the past, will repeat and also check a free T4. Will also add on a CBC and BMP. Can consider morning cortisol. Do not suspect chronic DVT with unilateral leg swelling, worse on the left.   Plan: - continue celexa 40 mg daily and hydralazine PRN to help with sleep - TSH, free T4, CBC, BMP pending. Consider morning cortisol - IBH referral placed  Addnedum: TSH, Free T4, BMP unremarkable. CBC unfortunately not drawn. Will repeat at next visit with no charge. Will try to get follow up in the morning to draw AM cortisol.

## 2022-08-01 NOTE — Patient Instructions (Signed)
Thank you, Ms.Johnn Hai for allowing [place] to provide your care today. Today we discussed   Anxiety Please continue the citalopram and hydroxyzine. I have sent in a prescription for propranolol which you can take as needed 30-60 minutes before anxiety provoking events  We will be checking blood work again today. Please to follow up in 2-4 weeks  I have ordered the following labs for you:   Lab Orders         CBC no Diff         BMP8+Anion Gap         T4, Free         TSH       Referrals ordered today:    Referral Orders         Ambulatory referral to [place]      I have ordered the following medication/changed the following medications:   Stop the following medications: There are no discontinued medications.   Start the following medications: No orders of the defined types were placed in this encounter.    Follow up: 2-4 weeks    Should you have any questions or concerns please call the internal medicine clinic at [phone].    Sanjuana Letters, D.O. [place]

## 2022-08-01 NOTE — Progress Notes (Signed)
CC: anxiety, sweating  HPI:  [name] is a 37 y.o. female living with a history stated below and presents today for follow up in regards to her frequent sweating and increased social anxiety. Please see problem based assessment and plan for additional details.  Past Medical History:  Diagnosis Date   Heart palpitations    Irregular menstrual bleeding    PCOS (polycystic ovarian syndrome)    Preeclampsia    Renal disorder     Current Outpatient Medications on File Prior to Visit  Medication Sig Dispense Refill   albuterol (PROVENTIL HFA;VENTOLIN HFA) 108 (90 BASE) MCG/ACT inhaler Inhale 2 puffs into the lungs every 2 (two) hours as needed for wheezing or shortness of breath (cough). 1 Inhaler 0   citalopram (CELEXA) 20 MG tablet TAKE 1 TABLET BY MOUTH EVERY DAY 30 tablet 0   fluticasone (FLOVENT HFA) 110 MCG/ACT inhaler Inhale into the lungs 2 (two) times daily.     hydrOXYzine (ATARAX) 10 MG tablet TABLET BY MOUTH AT BEDTIME AS NEEDED ANXIETY 90 tablet 1   [DISCONTINUED] cetirizine (ZYRTEC ALLERGY) 10 MG tablet Take 1 tablet (10 mg total) by mouth daily. 30 tablet 0   [DISCONTINUED] fluticasone (FLONASE) 50 MCG/ACT nasal spray Place 1 spray into both nostrils daily. 16 g 0   No current facility-administered medications on file prior to visit.    Review of Systems: ROS negative except for what is noted on the assessment and plan.  Vitals:   08/01/22 1359  BP: 120/71  Pulse: 81  Temp: 97.9 F (36.6 C)  TempSrc: Oral  SpO2: 100%  Weight: 158 lb 3.2 oz (71.8 kg)  Height: '5\' 1"'$  (1.549 m)    Physical Exam: Constitutional: well-appearing, in no acute distress HENT: normocephalic atraumatic Eyes: conjunctiva non-erythematous Neck: supple Cardiovascular: regular rate and rhythm, no m/r/g Pulmonary/Chest: normal work of breathing on room air, lungs clear to auscultation bilaterally Abdominal: soft, non-tender, non-distended MSK: normal bulk and  tone Neurological: alert & oriented x 3 Skin: warm and dry. Trace lower extremity edema Psych: normal mood  Assessment & Plan:   Anxiety with depression Assessment: Ms Sabrina Owens continues to have increased social anxiety. This has improved since increasing the celexa to twice in the morning and hydroxyzine 10-20 mg QHS. She notes her anxiety started after multiple life stressors including the passing of her aunt, her brother being diagnosed with throat cancer, and housing instability.   Since, she has had increased anxiety with any social situation. She endorses associated sweating requiring her to change clothes throughout her day, racing thoughts, and not wanting to go out in public. She denise any chest pain or palpitations. She denies having similar symptoms prior to these events. Other symptoms she has noticed are weight gain of 10-20 lbs (confirmed on chart review), periodic nausea and bilateral lower extremity swelling, mostly in her left leg. She has also noticed changes in her menstrual cycle with irregular bleeding. She notes her two pregnancies were complicated by hemorrhages.   She is interested in meeting with a counselor to speak with about these events. Will also add PRN propranolol to take before going out into anxiety provoking events. Heart rate without bradycardia.   While I do believe she has a component of anxiety, her symptoms warrant further work up to rule out any underlying metabolic disturbance or endocrinopathy.. She had a normal TSH in the past, will repeat and also check a free T4. Will also add on a CBC and BMP. Can  consider morning cortisol. Do not suspect chronic DVT with unilateral leg swelling, worse on the left.   Plan: - continue celexa 40 mg daily and hydralazine PRN to help with sleep - TSH, free T4, CBC, BMP pending. Consider morning cortisol - IBH referral placed  Patient discussed with Dr. Verlee Rossetti, D.O. [facility],  PGY-3 Phone: [phone] Date 08/02/2022 Time 8:08 AM

## 2022-08-02 ENCOUNTER — Encounter: Payer: Self-pay | Admitting: Student

## 2022-08-02 LAB — BMP8+ANION GAP
Anion Gap: 14 mmol/L (ref 10.0–18.0)
BUN/Creatinine Ratio: 14 (ref 9–23)
BUN: 11 mg/dL (ref 6–20)
CO2: 23 mmol/L (ref 20–29)
Calcium: 9.6 mg/dL (ref 8.7–10.2)
Chloride: 99 mmol/L (ref 96–106)
Creatinine, Ser: 0.8 mg/dL (ref 0.57–1.00)
Glucose: 90 mg/dL (ref 70–99)
Potassium: 4.3 mmol/L (ref 3.5–5.2)
Sodium: 136 mmol/L (ref 134–144)
eGFR: 96 mL/min/{1.73_m2} (ref >59)

## 2022-08-02 LAB — CBC

## 2022-08-02 LAB — TSH: TSH: 2.07 u[IU]/mL (ref 0.450–4.500)

## 2022-08-02 LAB — T4, FREE: Free T4: 1.14 ng/dL (ref 0.82–1.77)

## 2022-08-02 LAB — SPECIMEN STATUS REPORT

## 2022-08-06 ENCOUNTER — Other Ambulatory Visit: Payer: Self-pay

## 2022-08-06 ENCOUNTER — Emergency Department (HOSPITAL_BASED_OUTPATIENT_CLINIC_OR_DEPARTMENT_OTHER)
Admission: EM | Admit: 2022-08-06 | Discharge: 2022-08-06 | Disposition: A | Discharge status: Home / Self Care | Payer: 59 | Attending: Emergency Medicine | Admitting: Emergency Medicine

## 2022-08-06 ENCOUNTER — Encounter (HOSPITAL_BASED_OUTPATIENT_CLINIC_OR_DEPARTMENT_OTHER): Payer: Self-pay | Admitting: Emergency Medicine

## 2022-08-06 DIAGNOSIS — R609 Edema, unspecified: Secondary | ICD-10-CM

## 2022-08-06 DIAGNOSIS — R600 Localized edema: Secondary | ICD-10-CM | POA: Insufficient documentation

## 2022-08-06 DIAGNOSIS — M7989 Other specified soft tissue disorders: Secondary | ICD-10-CM | POA: Diagnosis present

## 2022-08-06 LAB — BASIC METABOLIC PANEL
Anion gap: 5 (ref 5–15)
BUN: 15 mg/dL (ref 6–20)
CO2: 25 mmol/L (ref 22–32)
Calcium: 9.3 mg/dL (ref 8.9–10.3)
Chloride: 106 mmol/L (ref 98–111)
Creatinine, Ser: 0.99 mg/dL (ref 0.44–1.00)
GFR, Estimated: >60 mL/min (ref >60)
Glucose, Bld: 100 mg/dL — ABNORMAL HIGH (ref 70–99)
Potassium: 4.1 mmol/L (ref 3.5–5.1)
Sodium: 136 mmol/L (ref 135–145)

## 2022-08-06 LAB — URINALYSIS, ROUTINE W REFLEX MICROSCOPIC
Bilirubin Urine: NEGATIVE
Glucose, UA: NEGATIVE mg/dL
Hgb urine dipstick: NEGATIVE
Ketones, ur: NEGATIVE mg/dL
Leukocytes,Ua: NEGATIVE
Nitrite: NEGATIVE
Specific Gravity, Urine: 1.029 (ref 1.005–1.030)
pH: 8 (ref 5.0–8.0)

## 2022-08-06 LAB — CBC
HCT: 37.5 % (ref 36.0–46.0)
Hemoglobin: 12.1 g/dL (ref 12.0–15.0)
MCH: 28.9 pg (ref 26.0–34.0)
MCHC: 32.3 g/dL (ref 30.0–36.0)
MCV: 89.5 fL (ref 80.0–100.0)
Platelets: 176 10*3/uL (ref 150–400)
RBC: 4.19 MIL/uL (ref 3.87–5.11)
RDW: 14.3 % (ref 11.5–15.5)
WBC: 4.7 10*3/uL (ref 4.0–10.5)
nRBC: 0 % (ref 0.0–0.2)

## 2022-08-06 NOTE — ED Notes (Signed)
Reviewed AVS with patient, patient expressed understanding of directions, denies further questions at this time. 

## 2022-08-06 NOTE — Progress Notes (Signed)
Internal Medicine Clinic Attending  Case discussed with Dr. Katsadouros  At the time of the visit.  We reviewed the resident's history and exam and pertinent patient test results.  I agree with the assessment, diagnosis, and plan of care documented in the resident's note.  

## 2022-08-06 NOTE — Discharge Instructions (Signed)
You are seen in the emergency department for swelling of your legs.  You had blood work that did not show an obvious explanation for your symptoms.  Please avoid prolonged sitting and try to elevate your legs during the day.  Try to lower your salt intake.  Follow-up with your primary care doctor for further evaluation.  You can also try wearing compression socks during the day which may help.

## 2022-08-06 NOTE — ED Provider Notes (Signed)
[facility] Provider Note   CSN: [id number] Arrival date & time: 08/06/22  2022     History {Add pertinent medical, surgical, social history, OB history to HPI:1} Chief Complaint  Patient presents with   Leg Swelling    Sabrina Owens is a 37 y.o. female.  She is here with a complaint of swelling in her legs it has been going on for a few months.  She seen her primary care doctor for it and they have done some blood work some of which is still pending.  She says they feel tight.  No numbness or weakness.  She does a lot of sitting at work.  She does not think she eats a particularly high salt diet.  The history is provided by the patient.       Home Medications Prior to Admission medications   Medication Sig Start Date End Date Taking? Authorizing Provider  albuterol (PROVENTIL HFA;VENTOLIN HFA) 108 (90 BASE) MCG/ACT inhaler Inhale 2 puffs into the lungs every 2 (two) hours as needed for wheezing or shortness of breath (cough). 05/02/13   Riki Altes, MD  citalopram (CELEXA) 20 MG tablet TAKE 1 TABLET BY MOUTH EVERY DAY 07/04/22   Farrel Gordon, DO  fluticasone (FLOVENT HFA) 110 MCG/ACT inhaler Inhale into the lungs 2 (two) times daily.    [provider]  hydrOXYzine (ATARAX) 10 MG tablet TABLET BY MOUTH AT BEDTIME AS NEEDED ANXIETY 03/06/22   Farrel Gordon, DO  propranolol (INDERAL) 10 MG tablet Take 1 pill 30-60 minutes prior to anxiety provoking situations 08/01/22   Riesa Pope, MD  cetirizine (ZYRTEC ALLERGY) 10 MG tablet Take 1 tablet (10 mg total) by mouth daily. 09/26/16 11/23/19  Avie Echevaria B, PA-C  fluticasone (FLONASE) 50 MCG/ACT nasal spray Place 1 spray into both nostrils daily. 09/26/16 11/23/19  Emeline General, PA-C      Allergies    Ancef [cefazolin]    Review of Systems   Review of Systems  Constitutional:  Negative for fever.  Cardiovascular:  Positive for leg swelling.  Musculoskeletal:   Negative for joint swelling.  Skin:  Negative for rash.    Physical Exam Updated Vital Signs BP 127/86 (BP Location: Right Arm)   Pulse 93   Temp 98.5 F (36.9 C) (Oral)   Resp 17   LMP 07/20/2022 Comment: tubal  SpO2 100%  Physical Exam Constitutional:      Appearance: Normal appearance. She is well-developed.  HENT:     Head: Normocephalic and atraumatic.  Eyes:     Conjunctiva/sclera: Conjunctivae normal.  Musculoskeletal:        General: No tenderness or deformity.     Cervical back: Neck supple.     Comments: She has maybe trace edema of her lower extremities.  It is nonpitting.  Joints do not appear to be affected.  No cords appreciated.  Distal pulses 2+.  No overlying skin changes.  Skin:    General: Skin is warm and dry.  Neurological:     General: No focal deficit present.     Mental Status: She is alert.     GCS: GCS eye subscore is 4. GCS verbal subscore is 5. GCS motor subscore is 6.     Sensory: No sensory deficit.     Motor: No weakness.     ED Results / Procedures / Treatments   Labs (all labs ordered are listed, but only abnormal results are displayed) Labs Reviewed  BASIC  METABOLIC PANEL - Abnormal; Notable for the following components:      Result Value   Glucose, Bld 100 (*)    All other components within normal limits  URINALYSIS, ROUTINE W REFLEX MICROSCOPIC - Abnormal; Notable for the following components:   Protein, ur TRACE (*)    All other components within normal limits  CBC    EKG None  Radiology No results found.  Procedures Procedures  {Document cardiac monitor, telemetry assessment procedure when appropriate:1}  Medications Ordered in ED Medications - No data to display  ED Course/ Medical Decision Making/ A&P   {   Click here for ABCD2, HEART and other calculatorsREFRESH Note before signing :1}                          Medical Decision Making Amount and/or Complexity of Data Reviewed Labs:  ordered.   ***  {Document critical care time when appropriate:1} {Document review of labs and clinical decision tools ie heart score, Chads2Vasc2 etc:1}  {Document your independent review of radiology images, and any outside records:1} {Document your discussion with family members, caretakers, and with consultants:1} {Document social determinants of health affecting pt's care:1} {Document your decision making why or why not admission, treatments were needed:1} Final Clinical Impression(s) / ED Diagnoses Final diagnoses:  None    Rx / DC Orders ED Discharge Orders     None

## 2022-08-06 NOTE — ED Triage Notes (Signed)
Bilateral leg swelling for "quite some time" (over a month) Denies injury, some tightness/pain  Reports some general body pains

## 2022-08-08 ENCOUNTER — Institutional Professional Consult (permissible substitution): Payer: Self-pay | Admitting: Licensed Clinical Social Worker

## 2022-08-15 ENCOUNTER — Ambulatory Visit (INDEPENDENT_AMBULATORY_CARE_PROVIDER_SITE_OTHER): Payer: 59 | Admitting: Student

## 2022-08-15 VITALS — BP 107/65 | HR 69 | Temp 98.0°F | Ht 61.0 in | Wt 161.5 lb

## 2022-08-15 DIAGNOSIS — R0609 Other forms of dyspnea: Secondary | ICD-10-CM

## 2022-08-15 DIAGNOSIS — M7989 Other specified soft tissue disorders: Secondary | ICD-10-CM | POA: Diagnosis not present

## 2022-08-15 DIAGNOSIS — R0600 Dyspnea, unspecified: Secondary | ICD-10-CM

## 2022-08-15 DIAGNOSIS — F418 Other specified anxiety disorders: Secondary | ICD-10-CM

## 2022-08-15 MED ORDER — CITALOPRAM HYDROBROMIDE 20 MG PO TABS: 40.0000 mg | ORAL_TABLET | Freq: Every day | ORAL | 0 refills | Status: DC

## 2022-08-15 MED ORDER — CITALOPRAM HYDROBROMIDE 40 MG PO TABS
40.0000 mg | ORAL_TABLET | Freq: Every day | ORAL | Status: DC
Start: 2022-08-15 — End: 2022-09-05

## 2022-08-15 NOTE — Patient Instructions (Signed)
Thank you, Sabrina Owens for allowing Sabrina Owens to provide your care today. Today we discussed .  Anxiety Rather than adjusting your medicines, at this point I would like to refer you to psychiatry. We will also check your cortisol levels  Shortness of breath I would like to order a test to check the lung functions and determine if you have COPD, asthma etc. They will call you to schedule this  Leg swelling Please keep monitoring this, if you notice it becomes persistent, worsening, or you notice rashes, redness and pain in your legs please call Sabrina Owens. Elevate your legs in the evening and wear compression stockings.    I have ordered the following labs for you:   Lab Orders         Albumin         Cortisol       Referrals ordered today:    Referral Orders         Ambulatory referral to Psychiatry      I have ordered the following medication/changed the following medications:   Stop the following medications: Medications Discontinued During This Encounter  Medication Reason   citalopram (CELEXA) 20 MG tablet Reorder     Start the following medications: Meds ordered this encounter  Medications   citalopram (CELEXA) 20 MG tablet    Sig: Take 2 tablets (40 mg total) by mouth daily.    Dispense:  30 tablet    Refill:  0     Follow up:  1 month     Should you have any questions or concerns please call the internal medicine clinic at [phone].    Sabrina Owens, D.O. [place]

## 2022-08-15 NOTE — Assessment & Plan Note (Addendum)
Assessment: Sabrina Owens continues to have anxiety, she also endorses depression and anhedonia. Continues to have episodes of tachycardia, diaphoresis. She is adherent to her celexa and titrated up to 40 mg daily. She has continued to use the hydroxyzine nightly. She did not have any events to where she felt she needed to use the propranolol beforehand. She self medicates by smoking marijuana multiple times per day.   With her persistent generalized anxiety disorder and depression severely affecting her ability to function/work despite medical interventions will refer to psychiatry for further assistance.   Prior work up for metabolic/endocrionpathies has been benign. Will check cortisol today as well.   Plan: - referral to psychiatry - continue celexa 40 mg daily and atarax QHS PRN - proprnolol PRN for anxiety inducing events.  - cortisol level

## 2022-08-15 NOTE — Progress Notes (Addendum)
CC: follow up generalized anxiety disorder and lower extremity swelling  HPI:  Sabrina Owens is a 37 y.o. female living with a history stated below and presents today for follow up generalized anxiety disorder and lower extremity swelling. Please see problem based assessment and plan for additional details.  Past Medical History:  Diagnosis Date   Heart palpitations    Irregular menstrual bleeding    PCOS (polycystic ovarian syndrome)    Preeclampsia    Renal disorder     Current Outpatient Medications on File Prior to Visit  Medication Sig Dispense Refill   albuterol (PROVENTIL HFA;VENTOLIN HFA) 108 (90 BASE) MCG/ACT inhaler Inhale 2 puffs into the lungs every 2 (two) hours as needed for wheezing or shortness of breath (cough). 1 Inhaler 0   fluticasone (FLOVENT HFA) 110 MCG/ACT inhaler Inhale into the lungs 2 (two) times daily.     hydrOXYzine (ATARAX) 10 MG tablet TABLET BY MOUTH AT BEDTIME AS NEEDED ANXIETY 90 tablet 1   propranolol (INDERAL) 10 MG tablet Take 1 pill 30-60 minutes prior to anxiety provoking situations 30 tablet 2   [DISCONTINUED] cetirizine (ZYRTEC ALLERGY) 10 MG tablet Take 1 tablet (10 mg total) by mouth daily. 30 tablet 0   [DISCONTINUED] fluticasone (FLONASE) 50 MCG/ACT nasal spray Place 1 spray into both nostrils daily. 16 g 0   No current facility-administered medications on file prior to visit.    Review of Systems: ROS negative except for what is noted on the assessment and plan.  Vitals:   08/15/22 0849  BP: 107/65  Pulse: 69  Temp: 98 F (36.7 C)  TempSrc: Oral  SpO2: 99%  Weight: 161 lb 8 oz (73.3 kg)  Height: 5\' 1"  (1.549 m)    Physical Exam: Constitutional: well-appearing, in no acute distress HENT: normocephalic atraumatic Eyes: conjunctiva non-erythematous Neck: supple Cardiovascular: regular rate and rhythm, 1/6 systolic murmur RUSB. No JVD. Trace lower extremity edema Pulmonary/Chest: normal work of breathing on room air,  lungs clear to auscultation bilaterally MSK: normal bulk and tone Neurological: alert & oriented x 3 Skin: warm and dry Psych: pleasant  Assessment & Plan:   Anxiety with depression Assessment: Sabrina Owens continues to have anxiety, she also endorses depression and anhedonia. Continues to have episodes of tachycardia, diaphoresis. She is adherent to her celexa and titrated up to 40 mg daily. She has continued to use the hydroxyzine nightly. She did not have any events to where she felt she needed to use the propranolol beforehand. She self medicates by smoking marijuana multiple times per day.   With her persistent generalized anxiety disorder and depression severely affecting her ability to function/work despite medical interventions will refer to psychiatry for further assistance.   Prior work up for metabolic/endocrionpathies has been benign. Will check cortisol today as well.   Plan: - referral to psychiatry - continue celexa 40 mg daily and atarax QHS PRN - proprnolol PRN for anxiety inducing events.  - cortisol level  Localized swelling of lower extremity Assessment: Endorses persistent bilateral lower extremity swelling. This has been occurring for the past few months. It is not present when she wakes up, but occurs throughout the day. She denies any lower extremity pain, orthopnea, or shortness of breath at rest. Denies any erythema to the legs. Does have some dyspnea with exertion that does improve with her albuterol inhaler.   On exam today she is euvolemic with no JVD, soft systolic murmur (chronic) and trace lower extremity edema.   DVT and  heart failure are low on my differential. Renal function benign, do not suspect nephrotic syndrome, though did have trace protein in recent UA. We will check albumin levels. Likely secondary to her obesity and venous insufficiency. Instructed her to keep legs elevated and purchase compression stockings.   Plan: - conservative management;  low salt diet, compression stockings, leg elevation in the evening - follow up albumin levels  Dyspnea on exertion Assessment: Endorses shortness of breath with climbing steps and exertion. Denies dyspnea at rest. This resolves if she uses her albuterol inhaler. She denies shortness of breath at rest. She endorses a tightness in her chest that occurs when she has a URI. This resolves with albuterol inhaler as well. She notes a history of bronchitis. Her lung examination is benign today and she is saturating well on room air.   With her symptoms improving with inahler use and exacerbated with illness will order PFT's to assess for asthma. She smokes marijuana daily and we discussed how this may be contributing to her symptoms  Plan: - PFT's - continue albuterol PRN  Patient discussed with Dr. Newell Coral, D.O. [facility], PGY-3 Phone: [phone] Date 08/15/2022 Time 2:35 PM

## 2022-08-15 NOTE — Assessment & Plan Note (Addendum)
Assessment: Endorses shortness of breath with climbing steps and exertion. Denies dyspnea at rest. This resolves if she uses her albuterol inhaler. She denies shortness of breath at rest. She endorses a tightness in her chest that occurs when she has a URI. This resolves with albuterol inhaler as well. She notes a history of bronchitis. Her lung examination is benign today and she is saturating well on room air.   With her symptoms improving with inahler use and exacerbated with illness will order PFT's to assess for asthma. She smokes marijuana daily and we discussed how this may be contributing to her symptoms  Plan: - PFT's - continue albuterol PRN

## 2022-08-15 NOTE — Assessment & Plan Note (Addendum)
Assessment: Endorses persistent bilateral lower extremity swelling. This has been occurring for the past few months. It is not present when she wakes up, but occurs throughout the day. She denies any lower extremity pain, orthopnea, or shortness of breath at rest. Denies any erythema to the legs. Does have some dyspnea with exertion that does improve with her albuterol inhaler.   On exam today she is euvolemic with no JVD, soft systolic murmur (chronic) and trace lower extremity edema.   DVT and heart failure are low on my differential. Renal function benign, do not suspect nephrotic syndrome, though did have trace protein in recent UA. We will check albumin levels. Likely secondary to her obesity and venous insufficiency. Instructed her to keep legs elevated and purchase compression stockings.   Plan: - conservative management; low salt diet, compression stockings, leg elevation in the evening - follow up albumin levels

## 2022-08-16 LAB — CORTISOL: Cortisol: 7.4 ug/dL (ref 6.2–19.4)

## 2022-08-16 LAB — ALBUMIN: Albumin: 4.1 g/dL (ref 3.9–4.9)

## 2022-08-22 NOTE — Progress Notes (Signed)
Internal Medicine Clinic Attending  Case discussed with Dr. Johnney Ou  At the time of the visit.  We reviewed the resident's history and exam and pertinent patient test results.  I agree with the assessment, diagnosis, and plan of care documented in the resident's note.  In absence of hyperthydoidism or other systemic illness, her symptoms are concerning for panic events.  Agree with referral to psychiatry.  While she is self-medicating her anxiety, the heavy daily marijuana use is worrisome for marijuana use disorder as well.  Caution with prn beta blocker as her BP is low normal at baseline.

## 2022-08-27 ENCOUNTER — Other Ambulatory Visit: Payer: Self-pay | Admitting: Internal Medicine

## 2022-08-28 NOTE — Telephone Encounter (Signed)
Refilled by Dr. Evie Lacks 03/27.

## 2022-09-05 ENCOUNTER — Other Ambulatory Visit: Payer: Self-pay

## 2022-09-05 ENCOUNTER — Other Ambulatory Visit: Payer: Self-pay | Admitting: Student

## 2022-09-05 ENCOUNTER — Encounter: Payer: Self-pay | Admitting: Student

## 2022-09-05 ENCOUNTER — Telehealth: Payer: Self-pay

## 2022-09-05 MED ORDER — CITALOPRAM HYDROBROMIDE 40 MG PO TABS
40.0000 mg | ORAL_TABLET | Freq: Every day | ORAL | 1 refills | Status: DC
Start: 2022-09-05 — End: 2022-09-07

## 2022-09-05 NOTE — Telephone Encounter (Signed)
Error

## 2022-09-05 NOTE — Telephone Encounter (Signed)
Sent Dr.Katsadouros a high priority message regarding patient needing a med refill for citalopram , patient is agitated due to not having her medication for the last few weeks. The last med refill did not send to the pharmacy correctly,it was sent as no print but I changed it to normal, Dr.Katsadouros sent the medication in. Advised patient Dr.Katsasdouros sent in her medication and she told me she looked up some of the side effects of the medication and she said she has been having some of the side effect like sweating,weight gain,muscle aches and more depression she wants to alternative medication to be sent in. Per Dr.Katsadouros patient will need a appointment  to discuss medication changes, patient is scheduled to come in 4/23.

## 2022-09-05 NOTE — Telephone Encounter (Signed)
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  Pt states at her visit with you on 08/15/2022 that her form would be sent to our office to be completed by you.  The form has been placed in the blue team's box.  She has been sch for a f/u appt on 09/11/2022 but is not able to wait until that day for it to be completed due to her @ home job.  Pt has also not heard from the [facility] facility for her f/u appt.  I have provided her the number to reach out to their office @ [phone].  IF she is not able to be seen, I have asked for her to contact our office back as she is really wanting to be seen as soon as possible.  The Pt was also concerned about her PFT's Appointment which has also not been sch. I have left a Message with the [facility] for her to be sch.

## 2022-09-05 NOTE — Telephone Encounter (Signed)
Patient is requesting med refill

## 2022-09-07 ENCOUNTER — Ambulatory Visit (INDEPENDENT_AMBULATORY_CARE_PROVIDER_SITE_OTHER): Payer: 59 | Admitting: Internal Medicine

## 2022-09-07 DIAGNOSIS — F418 Other specified anxiety disorders: Secondary | ICD-10-CM | POA: Diagnosis not present

## 2022-09-07 MED ORDER — CITALOPRAM HYDROBROMIDE 20 MG PO TABS: 20.0000 mg | ORAL_TABLET | Freq: Every day | ORAL | 0 refills | Status: DC

## 2022-09-07 NOTE — Progress Notes (Signed)
  [facility] Internal Medicine Residency Telephone Encounter Continuity Care Appointment  HPI:  This telephone encounter was created for Ms. Sabrina Owens on 09/07/2022 for the following purpose/cc med changes and work. Patient following up on recent medication discussion/OV. She had been taking Celexa 40mg  as opposed to the precsribed 20 so her Rx was updated to reflect this. She had been doubling up on pills because she did not notice much benefit. Did complain of side effects and says that since she has run out of medicine/been without her celexa, she has noticed that the side effects have resolved. She is requesting Rx be refilled until she can be seen by [facility].    Past Medical History:  Past Medical History:  Diagnosis Date   Heart palpitations    Irregular menstrual bleeding    PCOS (polycystic ovarian syndrome)    Preeclampsia    Renal disorder      ROS:  See hpi   Assessment / Plan / Recommendations:  Please see A&P under problem oriented charting for assessment of the patient's acute and chronic medical conditions.  Anxiety with depression Patient had been taking 40mg  celexa and complained of side effects. She has been without the medicine and has noted resolution of these side effects.  She is planned on [facility] follow up.  Given improvement in side effects after medication withdrawal, I do not wish to restart her at 40mg . I will send in 20mg  for her to take until she can be seen by [facility].  As always, pt is advised that if symptoms worsen or new symptoms arise, they should go to an urgent care facility or to to ER for further evaluation.   Consent and Medical Decision Making:  Patient discussed with Dr.  Lafonda Mosses This is a telephone encounter between Sabrina Owens and Adron Bene on 09/07/2022 for medication. The visit was conducted with the patient located at home and Adron Bene at [facility]. The patient's identity was confirmed using their DOB and current address. The patient has  consented to being evaluated through a telephone encounter and understands the associated risks (an examination cannot be done and the patient may need to come in for an appointment) / benefits (allows the patient to remain at home, decreasing exposure to coronavirus). I personally spent 15 minutes on medical discussion.

## 2022-09-08 ENCOUNTER — Encounter: Payer: Self-pay | Admitting: Internal Medicine

## 2022-09-08 NOTE — Assessment & Plan Note (Signed)
Patient had been taking  celexa and complained of side effects. She has been without the medicine and has noted resolution of these side effects.  She is planned on [facility] follow up.  Given improvement in side effects after medication withdrawal, I do not wish to restart her at . I will send in  for her to take until she can be seen by [facility].

## 2022-09-10 NOTE — Progress Notes (Signed)
Internal Medicine Clinic Attending ° °Case discussed with Dr. Gawaluck  At the time of the visit.  We reviewed the resident’s history and exam and pertinent patient test results.  I agree with the assessment, diagnosis, and plan of care documented in the resident’s note.  °

## 2022-09-11 ENCOUNTER — Telehealth: Payer: 59 | Admitting: Student

## 2022-09-12 ENCOUNTER — Telehealth: Payer: 59 | Admitting: Student

## 2022-09-14 ENCOUNTER — Inpatient Hospital Stay (HOSPITAL_COMMUNITY): Admission: RE | Admit: 2022-09-14 | Payer: 59 | Source: Ambulatory Visit

## 2022-09-19 ENCOUNTER — Ambulatory Visit (HOSPITAL_BASED_OUTPATIENT_CLINIC_OR_DEPARTMENT_OTHER): Payer: 59 | Admitting: Psychiatry

## 2022-09-19 ENCOUNTER — Encounter (HOSPITAL_COMMUNITY): Payer: Self-pay | Admitting: Psychiatry

## 2022-09-19 ENCOUNTER — Ambulatory Visit (HOSPITAL_COMMUNITY): Payer: 59 | Admitting: Psychiatry

## 2022-09-19 VITALS — Wt 161.0 lb

## 2022-09-19 DIAGNOSIS — F1210 Cannabis abuse, uncomplicated: Secondary | ICD-10-CM | POA: Diagnosis not present

## 2022-09-19 DIAGNOSIS — F331 Major depressive disorder, recurrent, moderate: Secondary | ICD-10-CM

## 2022-09-19 DIAGNOSIS — F411 Generalized anxiety disorder: Secondary | ICD-10-CM

## 2022-09-19 DIAGNOSIS — F4310 Post-traumatic stress disorder, unspecified: Secondary | ICD-10-CM

## 2022-09-19 MED ORDER — DULOXETINE HCL 20 MG PO CPEP: ORAL_CAPSULE | ORAL | 0 refills | Status: DC

## 2022-09-19 MED ORDER — HYDROXYZINE HCL 10 MG PO TABS: 10.0000 mg | ORAL_TABLET | Freq: Every evening | ORAL | 0 refills | Status: DC | PRN
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
## 2022-10-10 ENCOUNTER — Telehealth (HOSPITAL_BASED_OUTPATIENT_CLINIC_OR_DEPARTMENT_OTHER): Payer: 59 | Admitting: Psychiatry

## 2022-10-10 ENCOUNTER — Encounter (HOSPITAL_COMMUNITY): Payer: Self-pay | Admitting: Psychiatry

## 2022-10-10 ENCOUNTER — Telehealth (HOSPITAL_COMMUNITY): Payer: 59 | Admitting: Psychiatry

## 2022-10-10 VITALS — Wt 161.0 lb

## 2022-10-10 DIAGNOSIS — F331 Major depressive disorder, recurrent, moderate: Secondary | ICD-10-CM | POA: Diagnosis not present

## 2022-10-10 DIAGNOSIS — F411 Generalized anxiety disorder: Secondary | ICD-10-CM

## 2022-10-10 DIAGNOSIS — F1210 Cannabis abuse, uncomplicated: Secondary | ICD-10-CM | POA: Diagnosis not present

## 2022-10-10 MED ORDER — VENLAFAXINE HCL ER 37.5 MG PO CP24: ORAL_CAPSULE | ORAL | 0 refills | Status: DC

## 2022-10-10 MED ORDER — HYDROXYZINE HCL 25 MG PO TABS: 25.0000 mg | ORAL_TABLET | Freq: Every evening | ORAL | 0 refills | Status: DC | PRN
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
## 2022-10-11 ENCOUNTER — Other Ambulatory Visit (HOSPITAL_COMMUNITY): Payer: Self-pay | Admitting: *Deleted

## 2022-10-11 ENCOUNTER — Other Ambulatory Visit (HOSPITAL_COMMUNITY): Payer: Self-pay | Admitting: Psychiatry

## 2022-10-11 DIAGNOSIS — F4310 Post-traumatic stress disorder, unspecified: Secondary | ICD-10-CM

## 2022-10-11 DIAGNOSIS — F331 Major depressive disorder, recurrent, moderate: Secondary | ICD-10-CM

## 2022-10-11 DIAGNOSIS — F411 Generalized anxiety disorder: Secondary | ICD-10-CM

## 2022-10-11 MED ORDER — VENLAFAXINE HCL ER 75 MG PO CP24: 75.0000 mg | ORAL_CAPSULE | Freq: Every day | ORAL | 2 refills | Status: DC

## 2022-10-11 MED ORDER — VENLAFAXINE HCL ER 37.5 MG PO CP24: ORAL_CAPSULE | ORAL | 0 refills | Status: DC

## 2022-10-15 ENCOUNTER — Other Ambulatory Visit (HOSPITAL_COMMUNITY): Payer: Self-pay | Admitting: Psychiatry

## 2022-10-15 DIAGNOSIS — F411 Generalized anxiety disorder: Secondary | ICD-10-CM

## 2022-10-15 DIAGNOSIS — F1210 Cannabis abuse, uncomplicated: Secondary | ICD-10-CM

## 2022-10-15 DIAGNOSIS — F331 Major depressive disorder, recurrent, moderate: Secondary | ICD-10-CM

## 2022-11-05 ENCOUNTER — Other Ambulatory Visit: Payer: Self-pay | Admitting: Internal Medicine

## 2022-11-05 MED ORDER — ALBUTEROL SULFATE HFA 108 (90 BASE) MCG/ACT IN AERS: 2.0000 | INHALATION_SPRAY | RESPIRATORY_TRACT | 0 refills | Status: DC | PRN

## 2022-11-05 NOTE — Telephone Encounter (Signed)
albuterol (PROVENTIL HFA;VENTOLIN HFA) 108 (90 BASE) MCG/ACT inhaler    [id number] [place], [place] - [address] (Ph: [phone])

## 2022-11-08 ENCOUNTER — Encounter (HOSPITAL_COMMUNITY): Payer: Self-pay

## 2022-11-08 ENCOUNTER — Telehealth (HOSPITAL_BASED_OUTPATIENT_CLINIC_OR_DEPARTMENT_OTHER): Payer: Self-pay | Admitting: Psychiatry

## 2022-11-08 DIAGNOSIS — Z91199 Patient's noncompliance with other medical treatment and regimen due to unspecified reason: Secondary | ICD-10-CM

## 2022-11-08 NOTE — Progress Notes (Signed)
No show

## 2022-11-16 ENCOUNTER — Other Ambulatory Visit: Payer: Self-pay | Admitting: Student

## 2022-11-16 DIAGNOSIS — F418 Other specified anxiety disorders: Secondary | ICD-10-CM

## 2022-12-04 ENCOUNTER — Other Ambulatory Visit: Payer: Self-pay | Admitting: Internal Medicine

## 2022-12-25 ENCOUNTER — Encounter: Payer: 59 | Admitting: Internal Medicine

## 2023-01-08 ENCOUNTER — Encounter: Payer: 59 | Admitting: Internal Medicine

## 2023-01-08 ENCOUNTER — Other Ambulatory Visit: Payer: Self-pay | Admitting: Internal Medicine

## 2023-01-08 DIAGNOSIS — F411 Generalized anxiety disorder: Secondary | ICD-10-CM

## 2023-01-08 DIAGNOSIS — F1210 Cannabis abuse, uncomplicated: Secondary | ICD-10-CM

## 2023-01-08 DIAGNOSIS — F331 Major depressive disorder, recurrent, moderate: Secondary | ICD-10-CM

## 2023-01-08 NOTE — Progress Notes (Deleted)
GAD MDD PHQ-9 ***, GAD-7 ***. OP regimen is venlafaxine 75 mg daily, citalopram 20 mg daily and duloxetine 20 mg BID which she is*** adherent to. She also has hydroxyzine 25 mg at bedtime PRN and propranolol 10 mg PRN for anxiety-producing situations. Last visit with [facility] was in May 2024.  Plan:  Assessment and Plan: Patient is 37 year old African-American single, employed female who is referred for primary care.  I review psychosocial stressors, medication history, blood work results.  She stopped taking Celexa after having side effects related to weight gain, leg swelling and body pain.  She is currently not in any therapy.  In the past she had tried Prozac and Zoloft.  Recommend to try Cymbalta which she had never tried before to help anxiety.  Recommend to stop the cannabis and alcohol due to interaction with psychotropic medication.  Discussed to consider therapy for coping skills to help her nightmares and flashback.  Patient agreed with the plan.  We will start Cymbalta 20 mg daily for 1 week and then twice a day and restart the hydroxyzine 10-20 mg which helps her sleep.  We will refer her for therapy.  Recommend to call us back if she has any question, concern or if she feels worsening of the symptoms.  Discussed medication side effects and benefits.  Discussed safety concerns and any time having active suicidal thoughts or homicidal thought then she need to call 911 or go to local emergency room.  Follow-up in 3 weeks.

## 2023-01-08 NOTE — Telephone Encounter (Signed)
Next appt scheduled today 8/20 with PCP.

## 2023-01-22 ENCOUNTER — Other Ambulatory Visit: Payer: Self-pay | Admitting: Internal Medicine

## 2023-10-29 ENCOUNTER — Ambulatory Visit: Payer: Self-pay | Admitting: *Deleted

## 2023-10-29 ENCOUNTER — Other Ambulatory Visit: Payer: Self-pay | Admitting: Internal Medicine

## 2023-10-29 MED ORDER — FLUTICASONE PROPIONATE HFA 110 MCG/ACT IN AERO: 1.0000 | INHALATION_SPRAY | Freq: Two times a day (BID) | RESPIRATORY_TRACT | 1 refills | Status: DC

## 2023-10-29 MED ORDER — ALBUTEROL SULFATE HFA 108 (90 BASE) MCG/ACT IN AERS: 2.0000 | INHALATION_SPRAY | RESPIRATORY_TRACT | 2 refills | Status: DC | PRN

## 2023-10-29 NOTE — Telephone Encounter (Signed)
 Available appt this afternoon; pt was called. Pt stated unable to come today, but if there's a cancellation tomorrow, to call her. Otherwise, she'll be here Thursday.

## 2023-10-29 NOTE — Telephone Encounter (Unsigned)
 Copied from CRM [id number]. Topic: Clinical - Medication Refill >> Oct 29, 2023  9:21 AM Shamecia H wrote: Medication: albuterol  (VENTOLIN  HFA) 108 (90 Base) MCG/ACT inhaler,  fluticasone  (FLOVENT  HFA) 110 MCG/ACT inhaler   Has the patient contacted their pharmacy? Yes (Agent: If no, request that the patient contact the pharmacy for the refill. If patient does not wish to contact the pharmacy document the reason why and proceed with request.) (Agent: If yes, when and what did the pharmacy advise?)  This is the patient's preferred pharmacy:  [id number] [place], [place] - [address] [place] [address] Phone: [phone] Fax: [phone]   Is this the correct pharmacy for this prescription? Yes If no, delete pharmacy and type the correct one.   Has the prescription been filled recently? Yes  Is the patient out of the medication? Yes  Has the patient been seen for an appointment in the last year OR does the patient have an upcoming appointment? Yes  Can we respond through MyChart? Yes  Agent: Please be advised that Rx refills may take up to 3 business days. We ask that you follow-up with your pharmacy.

## 2023-10-29 NOTE — Telephone Encounter (Signed)
 Copied from CRM [id number]. Topic: Clinical - Red Word Triage >> Oct 29, 2023  9:22 AM Sabrina Owens wrote: [name] that prompted transfer to Nurse Triage: Hard time breathing, daughters God baby was over the weekend and she was sick, patient think she is getting sick but its affecting her breathing, she has a dry cough and she thinks its bronchitis, doesn't have a running nose, no fever. Just put a rx in for two of her inhaler refills. Slight pain in chest from coughing Reason for Disposition  [1] MILD difficulty breathing (e.g., minimal/no SOB at rest, SOB with walking, pulse <100) AND [2] still present when not coughing  Answer Assessment - Initial Assessment Questions 1. ONSET: "When did the cough begin?"      I am coughing a dry cough and having chest tightness.   It started Sun. Evening.   I'm having shortness of breath too.    I get bronchitis a lot.   I use an inhaler.   2. SEVERITY: "How bad is the cough today?"      It's getting worse 3. SPUTUM: "Describe the color of your sputum" (none, dry cough; clear, white, yellow, green)     Not coughing up anything 4. HEMOPTYSIS: "Are you coughing up any blood?" If so ask: "How much?" (flecks, streaks, tablespoons, etc.)     Not asked No fever 5. DIFFICULTY BREATHING: "Are you having difficulty breathing?" If Yes, ask: "How bad is it?" (e.g., mild, moderate, severe)    - MILD: No SOB at rest, mild SOB with walking, speaks normally in sentences, can lie down, no retractions, pulse < 100.    - MODERATE: SOB at rest, SOB with minimal exertion and prefers to sit, cannot lie down flat, speaks in phrases, mild retractions, audible wheezing, pulse 100-120.    - SEVERE: Very SOB at rest, speaks in single words, struggling to breathe, sitting hunched forward, retractions, pulse > 120      Yes shortness of breath with sitting too.   Hurts in chest when I cough. 6. FEVER: "Do you have a fever?" If Yes, ask: "What is your temperature, how was it measured, and  when did it start?"     No 7. CARDIAC HISTORY: "Do you have any history of heart disease?" (e.g., heart attack, congestive heart failure)      Not asked 8. LUNG HISTORY: "Do you have any history of lung disease?"  (e.g., pulmonary embolus, asthma, emphysema)     I get bronchitis 9. PE RISK FACTORS: "Do you have a history of blood clots?" (or: recent major surgery, recent prolonged travel, bedridden)     Not asked 10. OTHER SYMPTOMS: "Do you have any other symptoms?" (e.g., runny nose, wheezing, chest pain)       No runny nose, no fever, no sore throat. 11. PREGNANCY: "Is there any chance you are pregnant?" "When was your last menstrual period?"       Not asked 12. TRAVEL: "Have you traveled out of the country in the last month?" (e.g., travel history, exposures)       N/A  Protocols used: Cough - Acute Non-Productive-A-AH   FYI Only or Action Required?: FYI only for provider  Patient was last seen in primary care on 09/07/2022 by Gawaluck, [place], MD. Called Nurse Triage reporting Cough. Symptoms began several days ago. Interventions attempted: Nothing. Symptoms are: gradually worsening.  Triage Disposition: See HCP Within 4 Hours (Or PCP Triage)  Patient/caregiver understands and will follow disposition?: Yes Scheduled with  next available provider on 10/31/2023.

## 2023-10-31 ENCOUNTER — Ambulatory Visit: Payer: Self-pay | Admitting: Internal Medicine

## 2023-11-05 ENCOUNTER — Encounter: Payer: Self-pay | Admitting: Internal Medicine

## 2023-11-05 ENCOUNTER — Ambulatory Visit: Payer: Self-pay | Admitting: Internal Medicine

## 2023-11-05 VITALS — BP 114/80 | HR 76 | Temp 97.6°F | Ht 61.0 in | Wt 165.0 lb

## 2023-11-05 DIAGNOSIS — R0609 Other forms of dyspnea: Secondary | ICD-10-CM

## 2023-11-05 DIAGNOSIS — R0600 Dyspnea, unspecified: Secondary | ICD-10-CM

## 2023-11-05 MED ORDER — GUAIFENESIN ER 600 MG PO TB12: 1200.0000 mg | ORAL_TABLET | Freq: Two times a day (BID) | ORAL | 0 refills | Status: DC

## 2023-11-05 MED ORDER — ALBUTEROL SULFATE HFA 108 (90 BASE) MCG/ACT IN AERS
2.0000 | INHALATION_SPRAY | RESPIRATORY_TRACT | 2 refills | Status: DC | PRN
Start: 2023-11-05 — End: 2024-02-05

## 2023-11-05 MED ORDER — ARNUITY ELLIPTA 100 MCG/ACT IN AEPB
1.0000 | INHALATION_SPRAY | Freq: Every day | RESPIRATORY_TRACT | 2 refills | Status: DC
Start: 2023-11-05 — End: 2023-11-11

## 2023-11-05 NOTE — Assessment & Plan Note (Signed)
 Patient presents with 1 week of cough, congestion, and shortness of breath. She states her symptoms are the same as frequent episodes of bronchitis she has had in the past. She usually uses an albuterol  inhaler and fluticasone  inhaler, which she recently ran out of. She denies fever or other systemic symptoms. Denies tobacco use, endorses some marijuana use. She has previously been referred for PFTs but never completed them.  I suspect patient has undiagnosed asthma and needs PFTs for formal diagnosis. Her symptoms sound more like bronchitis or an asthma exacerbation rather than a bacterial infection requiring antibiotics.  - Refill albuterol , fluticasone  inhaler. Ordered Mucinex for expectoration - PFTs ordered

## 2023-11-05 NOTE — Patient Instructions (Addendum)
 Ms Kunde,   Regarding your shortness of breath and congestion, I am refilling your albuterol  and fluticasone  inhalers. I am also ordering Mucinex for your cough.   You will be receiving a call to schedule pulmonary function tests, which may help tell us  if you have asthma.   Thanks,  Dr Esaw Heckler

## 2023-11-05 NOTE — Progress Notes (Signed)
    Subjective:  CC: cough, congestion  HPI:  [name] is a 38 y.o. female with a past medical history stated below and presents today for above. Please see problem based assessment and plan for additional details.  Past Medical History:  Diagnosis Date   Heart palpitations    Irregular menstrual bleeding    PCOS (polycystic ovarian syndrome)    Preeclampsia    Renal disorder     Current Outpatient Medications on File Prior to Visit  Medication Sig Dispense Refill   citalopram  (CELEXA ) 20 MG tablet Take 1 tablet (20 mg total) by mouth daily. (Patient not taking: Reported on 09/19/2022) 30 tablet 0   DULoxetine  (CYMBALTA ) 20 MG capsule Take one capsule daily for one week and than twice daily (Patient not taking: Reported on 10/10/2022) 60 capsule 0   hydrOXYzine  (ATARAX ) 25 MG tablet Take 1-2 tablets (25-50 mg total) by mouth at bedtime as needed and may repeat dose one time if needed. 40 tablet 0   hydrOXYzine  (ATARAX ) 25 MG tablet Take 1-2 tablets (25-50 mg total) by mouth at bedtime as needed for anxiety (sleep). Additional refills require follow up with PCP office or psychiatry. 30 tablet 0   propranolol  (INDERAL ) 10 MG tablet TAKE 1 TABLET BY MOUTH EVERY 30-60 MIN BEFORE ANXIETY PROVOKING SITUATION 90 tablet 0   venlafaxine  XR (EFFEXOR  XR) 37.5 MG 24 hr capsule Take one capsule daily for one week. 7 capsule 0   venlafaxine  XR (EFFEXOR  XR) 75 MG 24 hr capsule Take 1 capsule (75 mg total) by mouth daily. After completing 1 week course of the 37.5 mg. 30 capsule 2   [DISCONTINUED] cetirizine  (ZYRTEC  ALLERGY) 10 MG tablet Take 1 tablet (10 mg total) by mouth daily. 30 tablet 0   [DISCONTINUED] fluticasone  (FLONASE ) 50 MCG/ACT nasal spray Place 1 spray into both nostrils daily. 16 g 0   No current facility-administered medications on file prior to visit.   Review of Systems: ROS negative except for as is noted on the assessment and plan.  Objective:   Vitals:   11/05/23  1101  BP: 114/80  Pulse: 76  Temp: 97.6 F (36.4 C)  TempSrc: Oral  SpO2: 100%  Weight: 165 lb (74.8 kg)  Height: 5' 1 (1.549 m)   Physical Exam: Constitutional: well-appearing, in no acute distress Cardiovascular: regular rate and rhythm Pulmonary/Chest: normal work of breathing on room air. No wheezes, clear breath sounds bilaterally. Mild congestion, some hoarseness MSK: normal bulk and tone  Assessment & Plan:   Dyspnea on exertion Patient presents with 1 week of cough, congestion, and shortness of breath. She states her symptoms are the same as frequent episodes of bronchitis she has had in the past. She usually uses an albuterol  inhaler and fluticasone  inhaler, which she recently ran out of. She denies fever or other systemic symptoms. Denies tobacco use, endorses some marijuana use. She has previously been referred for PFTs but never completed them.  I suspect patient has undiagnosed asthma and needs PFTs for formal diagnosis. Her symptoms sound more like bronchitis or an asthma exacerbation rather than a bacterial infection requiring antibiotics.  - Refill albuterol , fluticasone  inhaler. Ordered Mucinex for expectoration - PFTs ordered    Patient discussed with Dr. Tyrell Gallo MD [facility]  PGY-1 Pager: [phone] Date 11/05/2023  Time 1:58 PM

## 2023-11-07 NOTE — Progress Notes (Signed)
 Internal Medicine Clinic Attending  Case discussed with the resident at the time of the visit.  We reviewed the resident's history and exam and pertinent patient test results.  I agree with the assessment, diagnosis, and plan of care documented in the resident's note.

## 2023-11-07 NOTE — Telephone Encounter (Signed)
 Patient called she stated that her rx for Arnuity Ellipta is costing her $200 she is requesting a alternative medication at which she can afford. Patient stated she hasn't had her inhaler since her last visit and is requesting this to be addressed today.

## 2023-11-07 NOTE — Telephone Encounter (Signed)
 Copied from CRM [id number]. Topic: Clinical - Prescription Issue >> Nov 06, 2023  2:47 PM Blair Bumpers wrote: Reason for CRM: Patient called in stating that the cost of the Fluticasone  Furoate (ARNUITY ELLIPTA) 100 MCG/ACT AEPB is $200 with her insurance. Patient wants to know if there is something else that she can get that may be covered on her insurance or may be less expensive. Please give her a call back to advise. CB #: [phone]. >> Nov 07, 2023  8:24 AM Tisa Forester wrote: Patient calling back on status of issue with her medication  Fluticasone  Furoate (ARNUITY ELLIPTA) 100 MCG/ACT AEPB i, inhaler issue with cost of $200 with her insurance , patient is frustrated due to was promised a call back ON 6/18/25and no one reached out to patient , patient transfer to [facility]

## 2023-11-11 ENCOUNTER — Telehealth: Payer: Self-pay

## 2023-11-11 ENCOUNTER — Ambulatory Visit: Payer: Self-pay

## 2023-11-11 ENCOUNTER — Other Ambulatory Visit: Payer: Self-pay | Admitting: Student

## 2023-11-11 ENCOUNTER — Other Ambulatory Visit (HOSPITAL_COMMUNITY): Payer: Self-pay

## 2023-11-11 DIAGNOSIS — R0600 Dyspnea, unspecified: Secondary | ICD-10-CM

## 2023-11-11 MED ORDER — FLUTICASONE-SALMETEROL 100-50 MCG/ACT IN AEPB: 1.0000 | INHALATION_SPRAY | Freq: Two times a day (BID) | RESPIRATORY_TRACT | 2 refills | Status: DC

## 2023-11-11 NOTE — Telephone Encounter (Signed)
  FYI Only or Action Required?: Action required by provider: medication refill request.  Patient was last seen in primary care on 11/05/2023 by Francella Rogue, MD. Called Nurse Triage reporting Medication Problem. Symptoms began several days ago. Interventions attempted: Rest, hydration, or home remedies. Symptoms are: stable.  Triage Disposition: Call PCP Now  Patient/caregiver understands and will follow disposition?: Yes     Copied from CRM [id number]. Topic: Clinical - Red Word Triage >> Nov 11, 2023  8:16 AM Sabrina Owens wrote: [name] that prompted transfer to Nurse Triage: Patient upset and frustrated. Patient is still having issues breathing. Patient was prescribed an inhaler last week that is $200 and cannot afford it. Patient has been calling the office back all last week and was told last Thursday someone would call her back for Owens new inhaler. Office is closed and does not open until.  Sending to NT due to patient having issues breathing. Reason for Disposition  [1] Caller requests to speak ONLY to PCP AND [2] URGENT question  Answer Assessment - Initial Assessment Questions 1. REASON FOR CALL or QUESTION: What is your reason for calling today? or How can I best help you? or What question do you have that I can help answer?     Patient frustrated because no one called her back last week regarding her inhaler. Pt was prescribed Arnuity and the cost is $200. Pt says that she feels like she is not being heard and is completely disregarded. She says that at this point she wants to find Owens new PCP because she is being treated poorly. Pt refusing triage at this time. Call escalated to [facility]  2. CALLER: Document the source of call. (e.g., laboratory, patient).     Patient  Protocols used: PCP Call - No Triage-Owens-AH

## 2023-11-11 NOTE — Telephone Encounter (Signed)
 Terrilyn Kiang (KeyBETHA APPLETHWAITE) PA Case ID #: [id number] Rx #: [id number] Need Help? Call us  at [phone] Outcome Approved today by [employer] 2017 NCPDP Request Reference Number: [id number]. WIXELA INHUB AER 100/50 is approved through 11/10/2024. Your patient may now fill this prescription and it will be covered. Effective Date: 11/11/2023 Authorization Expiration Date: 11/10/2024 Drug Wixela Inhub 100-50MCG/ACT aerosol powder ePA cloud logo Form [employer] Electronic Prior Authorization Form [phone] NCPDP) Original Claim Info 75 Step Therapy Required IF PA REQUIRED, PLEASE CALL [phone] Requires Prior Authorization

## 2023-11-11 NOTE — Telephone Encounter (Signed)
 Prior Authorization for patient Sabrina Owens 100-50MCG/ACT aerosol powder) came through on cover my meds was submitted with last office notes awaiting approval or denial.  KEY:[id number]

## 2023-11-11 NOTE — Telephone Encounter (Signed)
 Call from patient though E2C2.  Patient unable to afford her new Inhaler.  Upset that she has not gotten a response.  Clinics have been closed since Thursday.  Patient was informed that  I would have to get the message to 1 of the doctors here today. Patient interrupted when trying to speak to tell her that I would get the information that she is unable to afford the new Inhale and is asking for something different.   Patient consistently said that no one knew what she was going through.  I was eventually able to speak with Dr. Elicia who is going to see what else patient can use and sent d to the Pharmacy.  Patient was informed that I had spoke with 1 of the doctors who plans to do a new prescription for her.

## 2023-11-12 NOTE — Telephone Encounter (Signed)
 Call to pharmacy patient was able to pick up the Arnuity Ellipta using the $35 Card .

## 2023-11-20 ENCOUNTER — Other Ambulatory Visit: Payer: Self-pay

## 2023-11-20 MED ORDER — HYDROXYZINE HCL 25 MG PO TABS: 25.0000 mg | ORAL_TABLET | Freq: Every evening | ORAL | 0 refills | Status: DC | PRN

## 2023-11-20 NOTE — Telephone Encounter (Signed)
 Pharmacy requesting a 90 day supply

## 2023-11-25 ENCOUNTER — Other Ambulatory Visit: Payer: Self-pay

## 2023-11-25 ENCOUNTER — Emergency Department (HOSPITAL_BASED_OUTPATIENT_CLINIC_OR_DEPARTMENT_OTHER)
Admission: EM | Admit: 2023-11-25 | Discharge: 2023-11-25 | Disposition: A | Discharge status: Home / Self Care | Attending: Student | Admitting: Student

## 2023-11-25 ENCOUNTER — Encounter (HOSPITAL_BASED_OUTPATIENT_CLINIC_OR_DEPARTMENT_OTHER): Payer: Self-pay

## 2023-11-25 ENCOUNTER — Ambulatory Visit: Payer: Self-pay | Admitting: *Deleted

## 2023-11-25 DIAGNOSIS — X158XXA Contact with other hot household appliances, initial encounter: Secondary | ICD-10-CM | POA: Insufficient documentation

## 2023-11-25 DIAGNOSIS — T2121XA Burn of second degree of chest wall, initial encounter: Secondary | ICD-10-CM | POA: Diagnosis present

## 2023-11-25 DIAGNOSIS — T2122XA Burn of second degree of abdominal wall, initial encounter: Secondary | ICD-10-CM | POA: Diagnosis not present

## 2023-11-25 DIAGNOSIS — T310 Burns involving less than 10% of body surface: Secondary | ICD-10-CM | POA: Insufficient documentation

## 2023-11-25 MED ORDER — BACITRACIN ZINC 500 UNIT/GM EX OINT
TOPICAL_OINTMENT | Freq: Two times a day (BID) | CUTANEOUS | Status: DC
Start: 2023-11-25 — End: 2023-11-25

## 2023-11-25 NOTE — Discharge Instructions (Signed)
 Recheck with your primary care provider.  Return to ER for worsening or concerning symptoms. Dress burn with bacitracin  ointment.  You can take Motrin  as needed as directed for pain.
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
## 2023-11-25 NOTE — ED Notes (Signed)
 RN reviewed discharge instructions with pt. Pt verbalized understanding and had no further questions. VSS upon discharge.

## 2023-11-27 ENCOUNTER — Telehealth: Payer: Self-pay | Admitting: *Deleted

## 2023-11-27 NOTE — Telephone Encounter (Signed)
 Copied from CRM [id number]. Topic: Referral - Status >> Nov 27, 2023  9:46 AM Susanna ORN wrote: Reason for CRM: Patient states she was referred for a Pulmonary Function Test back on June 17th and no one has contacted her to get scheduled. Patient is frustrated and states that it shouldn't take that long. She stated that she has her own insurance and wants to know if she needs to go somewhere else or if there is a problem. Patient states she could schedule her own appt for a function test elsewhere. Please check into this and give patient a call back to advise. CB #: [phone].

## 2023-11-28 ENCOUNTER — Telehealth: Payer: Self-pay

## 2023-11-28 NOTE — Telephone Encounter (Signed)
 Patient is returning call. She stated that she does not want to schedule an appointment. Transferred to [facility].

## 2023-11-28 NOTE — Telephone Encounter (Signed)
 I attempted to reach pt regarding paperwork that I have received from [facility], but no answer. I left message to call the clinic back so I can sch her an appt to discuss about this paperwork. Form will be in the red team box under miscellaneous paperwork.

## 2023-12-02 ENCOUNTER — Ambulatory Visit: Payer: Self-pay

## 2023-12-02 NOTE — Telephone Encounter (Signed)
 Pt has an appt tomorrow w/Dr Amoako.
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
## 2023-12-03 ENCOUNTER — Other Ambulatory Visit: Payer: Self-pay

## 2023-12-03 ENCOUNTER — Ambulatory Visit: Admitting: Student

## 2023-12-03 ENCOUNTER — Encounter: Payer: Self-pay | Admitting: Student

## 2023-12-03 VITALS — BP 109/63 | HR 76 | Temp 98.7°F | Ht 60.0 in | Wt 168.2 lb

## 2023-12-03 DIAGNOSIS — F1210 Cannabis abuse, uncomplicated: Secondary | ICD-10-CM | POA: Diagnosis not present

## 2023-12-03 DIAGNOSIS — F418 Other specified anxiety disorders: Secondary | ICD-10-CM

## 2023-12-03 MED ORDER — DULOXETINE HCL 20 MG PO CPEP: ORAL_CAPSULE | ORAL | 0 refills | Status: DC

## 2023-12-03 MED ORDER — HYDROXYZINE HCL 25 MG PO TABS: 25.0000 mg | ORAL_TABLET | Freq: Every evening | ORAL | 0 refills | Status: DC | PRN

## 2023-12-03 NOTE — Assessment & Plan Note (Signed)
 Reports she hasn't used any THC products in a while (over 3 months).Her efforts are applauded and she is encouraged to continue abstaining. I took an ample about of time to counsel on drug abuse. - Consider revisiting how she is doing during her next office visit
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
## 2023-12-03 NOTE — Patient Instructions (Signed)
 Thank you, [name] for allowing us  to provide your care today. Today we discussed .    I have ordered the following labs for you:  Lab Orders  No laboratory test(s) ordered today     Tests ordered today:    Referrals ordered today:   Referral Orders  No referral(s) requested today     I have ordered the following medication/changed the following medications:   Stop the following medications: There are no discontinued medications.   Start the following medications: No orders of the defined types were placed in this encounter.    Follow up: 4-6 months   Remember:   Should you have any questions or concerns please call the internal medicine clinic at [phone].   Drue Lisa Grow MD 12/03/2023, 11:21 AM   [facility]
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
## 2023-12-04 NOTE — Progress Notes (Signed)
 Internal Medicine Clinic Attending  Case discussed with the resident at the time of the visit.  We reviewed the resident's history and exam and pertinent patient test results.  I agree with the assessment, diagnosis, and plan of care documented in the resident's note.

## 2023-12-10 ENCOUNTER — Ambulatory Visit (HOSPITAL_COMMUNITY)
Admission: RE | Admit: 2023-12-10 | Discharge: 2023-12-10 | Disposition: A | Discharge status: Home / Self Care | Source: Ambulatory Visit | Attending: Family Medicine | Admitting: Family Medicine

## 2023-12-10 DIAGNOSIS — R0600 Dyspnea, unspecified: Secondary | ICD-10-CM | POA: Diagnosis present

## 2023-12-10 LAB — PULMONARY FUNCTION TEST
FEF 25-75 Post: 3 L/s
FEF 25-75 Pre: 2.39 L/s
FEF2575-%Change-Post: 25 %
FEF2575-%Pred-Post: 102 %
FEF2575-%Pred-Pre: 81 %
FEV1-%Change-Post: 12 %
FEV1-%Pred-Post: 80 %
FEV1-%Pred-Pre: 71 %
FEV1-Post: 2.18 L
FEV1-Pre: 1.93 L
FEV1FVC-%Change-Post: -11 %
FEV1FVC-%Pred-Pre: 121 %
FEV6-%Change-Post: 27 %
FEV6-%Pred-Post: 76 %
FEV6-%Pred-Pre: 59 %
FEV6-Post: 2.46 L
FEV6-Pre: 1.93 L
FEV6FVC-%Pred-Post: 101 %
FEV6FVC-%Pred-Pre: 101 %
FVC-%Change-Post: 27 %
FVC-%Pred-Post: 75 %
FVC-%Pred-Pre: 58 %
FVC-Post: 2.46 L
FVC-Pre: 1.93 L
Post FEV1/FVC ratio: 88 %
Post FEV6/FVC ratio: 100 %
Pre FEV1/FVC ratio: 100 %
Pre FEV6/FVC Ratio: 100 %

## 2023-12-10 MED ORDER — ALBUTEROL SULFATE (2.5 MG/3ML) 0.083% IN NEBU
2.5000 mg | INHALATION_SOLUTION | Freq: Once | RESPIRATORY_TRACT | Status: AC
  Administered 2023-12-10: 2.5 mg via RESPIRATORY_TRACT

## 2023-12-16 ENCOUNTER — Telehealth: Payer: Self-pay | Admitting: *Deleted

## 2023-12-16 DIAGNOSIS — R0609 Other forms of dyspnea: Secondary | ICD-10-CM

## 2023-12-16 NOTE — Telephone Encounter (Signed)
 Will forward to Dr. Amoako.  Copied from CRM [id number]. Topic: Clinical - Lab/Test Results >> Dec 16, 2023  8:49 AM Zane F wrote: Reason for CRM:   Patient is calling in to discuss the results of a recent PFT procedure she had done on last week 12/10/2023. Please contact patient with an update.   Callback Number: [id number]

## 2023-12-17 NOTE — Telephone Encounter (Signed)
 Reviewed PFTs, unfortunately limited information gleaned, possible restrictive process, marked improvement with albuterol . I do think I can really interpret that very well, I discussed with patient potential referral over to pulmonology which she was agreeable with, for now she will continue on arnuity and albuterol  inhalers.

## 2023-12-17 NOTE — Telephone Encounter (Signed)
 Patient called upset that she has not received a call from the doctor concerning her PT results.

## 2024-01-03 ENCOUNTER — Other Ambulatory Visit: Payer: Self-pay | Admitting: Student

## 2024-01-03 DIAGNOSIS — F418 Other specified anxiety disorders: Secondary | ICD-10-CM

## 2024-02-05 ENCOUNTER — Other Ambulatory Visit (HOSPITAL_BASED_OUTPATIENT_CLINIC_OR_DEPARTMENT_OTHER): Payer: Self-pay

## 2024-02-05 ENCOUNTER — Ambulatory Visit (HOSPITAL_BASED_OUTPATIENT_CLINIC_OR_DEPARTMENT_OTHER): Admitting: Pulmonary Disease

## 2024-02-05 ENCOUNTER — Other Ambulatory Visit: Payer: Self-pay

## 2024-02-05 ENCOUNTER — Encounter (HOSPITAL_BASED_OUTPATIENT_CLINIC_OR_DEPARTMENT_OTHER): Payer: Self-pay | Admitting: Pulmonary Disease

## 2024-02-05 VITALS — BP 105/70 | HR 77 | Ht 60.0 in | Wt 170.9 lb

## 2024-02-05 DIAGNOSIS — J4540 Moderate persistent asthma, uncomplicated: Secondary | ICD-10-CM | POA: Diagnosis not present

## 2024-02-05 DIAGNOSIS — Z87891 Personal history of nicotine dependence: Secondary | ICD-10-CM | POA: Diagnosis not present

## 2024-02-05 DIAGNOSIS — R0600 Dyspnea, unspecified: Secondary | ICD-10-CM

## 2024-02-05 DIAGNOSIS — F418 Other specified anxiety disorders: Secondary | ICD-10-CM

## 2024-02-05 MED ORDER — BUDESONIDE-FORMOTEROL FUMARATE 160-4.5 MCG/ACT IN AERO
2.0000 | INHALATION_SPRAY | Freq: Two times a day (BID) | RESPIRATORY_TRACT | 5 refills | Status: AC
  Filled 2024-02-05: qty 10.2, 30d supply, fill #0
  Filled 2024-04-01 – 2024-05-20 (×2): qty 10.2, 30d supply, fill #1

## 2024-02-05 MED ORDER — ALBUTEROL SULFATE HFA 108 (90 BASE) MCG/ACT IN AERS
2.0000 | INHALATION_SPRAY | RESPIRATORY_TRACT | 3 refills | Status: AC | PRN
  Filled 2024-02-05: qty 8.5, 17d supply, fill #0
  Filled 2024-04-01 – 2024-05-20 (×2): qty 6.7, 17d supply, fill #1

## 2024-02-05 MED ORDER — ALBUTEROL SULFATE (2.5 MG/3ML) 0.083% IN NEBU
2.5000 mg | INHALATION_SOLUTION | Freq: Four times a day (QID) | RESPIRATORY_TRACT | 2 refills | Status: AC | PRN
  Filled 2024-02-05: qty 75, 7d supply, fill #0
  Filled 2024-04-01: qty 75, 7d supply, fill #1

## 2024-02-05 NOTE — Progress Notes (Signed)
 Subjective:   PATIENT ID: Sabrina Owens GENDER: female DOB: Oct 20, 1985, MRN: [id number]  Chief Complaint  Patient presents with   Consult    Shortness of breath    Reason for Visit: New consult for shortness of breath  Sabrina Owens is a 38 year old female former smoker with PCOS, palpitations who presents for evaluation for abnormal PFTs.  She reports shortness of breath, chest tightness and wheezing at all times. Longstanding symptoms but worsening in the last 3-4 months. Worsens with walking short distances and walking upstairs. Screaming and yelling will cause her to be dizzy and will cause her to lose her voice. Never had childhood asthma but reports frequent bronchitis flares since your teens. She has primarily been on albuterol  and was started Arnuity since 11/2023. She uses albuterol  2-4 times a day and does acknowledge arnuity helps. Has tried nebulizers in the past with improvement in symptoms. Daughter and aunt (passed middle aged) has asthma  Social History: Quit smoking. Social smoker. Weed daily with smoker Works from home Dogs- short haired. Indoor and outdoor  Environmental exposures: None  I have personally reviewed patient's past medical/family/social history, allergies, current medications.  Past Medical History:  Diagnosis Date   Heart palpitations    Irregular menstrual bleeding    PCOS (polycystic ovarian syndrome)    Preeclampsia    Renal disorder      Family History  Problem Relation Age of Onset   High Cholesterol Mother    Sickle cell trait Mother    Diabetes Other      Social History   Occupational History   Not on file  Tobacco Use   Smoking status: Former    Current packs/day: 0.00    Types: Cigarettes    Quit date: 2021    Years since quitting: 4.7   Smokeless tobacco: Never  Vaping Use   Vaping status: Never Used  Substance and Sexual Activity   Alcohol use: Yes    Comment: Occasional, mostly weekends   Drug use: Yes     Types: Marijuana    Comment: denies 05/02/16   Sexual activity: Not on file    Allergies  Allergen Reactions   Ancef [Cefazolin] Hives     Outpatient Medications Prior to Visit  Medication Sig Dispense Refill   citalopram  (CELEXA ) 20 MG tablet Take 1 tablet (20 mg total) by mouth daily. 30 tablet 0   DULoxetine  (CYMBALTA ) 20 MG capsule TAKE ONE CAPSULE DAILY FOR ONE WEEK AND THAN TWICE DAILY 180 capsule 1   hydrOXYzine  (ATARAX ) 25 MG tablet Take 1-2 tablets (25-50 mg total) by mouth at bedtime as needed for anxiety (sleep). Additional refills require follow up with PCP office or psychiatry. 30 tablet 0   propranolol  (INDERAL ) 10 MG tablet TAKE 1 TABLET BY MOUTH EVERY 30-60 MIN BEFORE ANXIETY PROVOKING SITUATION 90 tablet 0   albuterol  (VENTOLIN  HFA) 108 (90 Base) MCG/ACT inhaler Inhale 2 puffs into the lungs every 2 (two) hours as needed for wheezing or shortness of breath (cough). 8.5 each 2   fluticasone -salmeterol (WIXELA INHUB) 100-50 MCG/ACT AEPB Inhale 1 puff into the lungs 2 (two) times daily. 60 each 2   guaiFENesin  (MUCINEX ) 600 MG 12 hr tablet Take 2 tablets (1,200 mg total) by mouth 2 (two) times daily. (Patient not taking: Reported on 02/05/2024) 60 tablet 0   hydrOXYzine  (ATARAX ) 25 MG tablet Take 1-2 tablets (25-50 mg total) by mouth at bedtime as needed and may repeat dose one time  if needed. (Patient not taking: Reported on 02/05/2024) 40 tablet 0   No facility-administered medications prior to visit.    Review of Systems  Constitutional:  Negative for chills, diaphoresis, fever, malaise/fatigue and weight loss.  HENT:  Negative for congestion.   Respiratory:  Positive for cough, sputum production, shortness of breath and wheezing. Negative for hemoptysis.   Cardiovascular:  Negative for chest pain, palpitations and leg swelling.     Objective:   Vitals:   02/05/24 0933  BP: 105/70  Pulse: 77  SpO2: 100%  Weight: 170 lb 14.4 oz (77.5 kg)  Height: 5' (1.524 m)    SpO2: 100 %  Physical Exam: General: Well-appearing, no acute distress HENT: [place], AT Eyes: EOMI, no scleral icterus Respiratory: Clear to auscultation bilaterally.  No crackles, wheezing or rales Cardiovascular: RRR, -M/R/G, no JVD Extremities:-Edema,-tenderness Neuro: AAO x4, CNII-XII grossly intact Psych: Normal mood, normal affect  Data Reviewed:  Imaging:  PFT: 12/10/23 FVC 2.46 (75%) FEV1 2.18 (80%) Ratio 88. Significant bronchodilator response with change in FVC +27% and FEV1 +12%. Interpretation: No spirometry. +BD response suggestive asthma  Labs: CBC    Component Value Date/Time   WBC 4.7 08/06/2022 2032   RBC 4.19 08/06/2022 2032   HGB 12.1 08/06/2022 2032   HGB CANCELED 08/01/2022 1515   HGB 12.4 10/11/2014 1026   HCT 37.5 08/06/2022 2032   HCT CANCELED 08/01/2022 1515   HCT 38.7 10/11/2014 1026   PLT 176 08/06/2022 2032   PLT CANCELED 08/01/2022 1515   MCV 89.5 08/06/2022 2032   MCV 88.7 10/11/2014 1026   MCH 28.9 08/06/2022 2032   MCHC 32.3 08/06/2022 2032   RDW 14.3 08/06/2022 2032   RDW 14.2 10/11/2014 1026   LYMPHSABS 1.5 07/24/2020 1052   LYMPHSABS 1.7 10/11/2014 1026   MONOABS 0.3 07/24/2020 1052   MONOABS 0.3 10/11/2014 1026   EOSABS 0.0 07/24/2020 1052   EOSABS 0.0 10/11/2014 1026   BASOSABS 0.0 07/24/2020 1052   BASOSABS 0.0 10/11/2014 1026   No recent eos available     Assessment & Plan:   Discussion: 38 year old female former smoker with PCOS, palpitations who presents for evaluation for abnormal PFTs. Reduced FVC and FEV1 normalized with albuterol  with significant bronchodilator response which is suggestive of asthma. Discussed clinical course and management of asthma including bronchodilator regimen, preventive care and action plan for exacerbation.    Moderate persistent asthma --START Symbicort  160-4.5 mcg TWO puffs in the morning and evening. Rinse mouth after use --CONTINUE Albuterol  1-2 puffs AS NEEDED for shortness of  breath or wheezing --GO TO LAB: CBC with diff and IgE --QUIT smoking --Nebulizer ordered  Health Maintenance Immunization History  Administered Date(s) Administered   Tdap 01/08/2022   CT Lung Screen - not qualified  Orders Placed This Encounter  Procedures   IgE   CBC with Differential   Ambulatory Referral for DME    Referral Priority:   Routine    Referral Type:   Durable Medical Equipment Purchase    Number of Visits Requested:   1   Meds ordered this encounter  Medications   budesonide -formoterol  (SYMBICORT ) 160-4.5 MCG/ACT inhaler    Sig: Inhale 2 puffs into the lungs 2 (two) times daily.    Dispense:  10.2 g    Refill:  5    Brand name   albuterol  (VENTOLIN  HFA) 108 (90 Base) MCG/ACT inhaler    Sig: Inhale 2 puffs into the lungs every 2 (two) hours as needed for wheezing  or shortness of breath (cough).    Dispense:  8.5 g    Refill:  3   albuterol  (PROVENTIL ) (2.5 MG/3ML) 0.083% nebulizer solution    Sig: Take 3 mLs (2.5 mg total) by nebulization every 6 (six) hours as needed for wheezing or shortness of breath.    Dispense:  75 mL    Refill:  2    Return for November or December.  I have spent a total time of 45-minutes on the day of the appointment reviewing prior documentation, coordinating care and discussing medical diagnosis and plan with the patient/family. Imaging, labs and tests included in this note have been reviewed and interpreted independently by me.  Sabrina Owens Staff, MD [facility] 02/05/2024 10:15 AM

## 2024-02-05 NOTE — Patient Instructions (Signed)
 Moderate persistent asthma --START Symbicort  160-4.5 mcg TWO puffs in the morning and evening. Rinse mouth after use --CONTINUE Albuterol  1-2 puffs AS NEEDED for shortness of breath or wheezing --GO TO LAB: CBC with diff and IgE --QUIT smoking

## 2024-02-06 MED ORDER — PROPRANOLOL HCL 10 MG PO TABS: ORAL_TABLET | ORAL | 0 refills | Status: AC

## 2024-02-07 LAB — CBC WITH DIFFERENTIAL/PLATELET
Basophils Absolute: 0 x10E3/uL (ref 0.0–0.2)
Basos: 0 %
EOS (ABSOLUTE): 0.1 x10E3/uL (ref 0.0–0.4)
Eos: 3 %
Hematocrit: 37.5 % (ref 34.0–46.6)
Hemoglobin: 11.4 g/dL (ref 11.1–15.9)
Immature Grans (Abs): 0 x10E3/uL (ref 0.0–0.1)
Immature Granulocytes: 0 %
Lymphocytes Absolute: 1.7 x10E3/uL (ref 0.7–3.1)
Lymphs: 43 %
MCH: 27.9 pg (ref 26.6–33.0)
MCHC: 30.4 g/dL — ABNORMAL LOW (ref 31.5–35.7)
MCV: 92 fL (ref 79–97)
Monocytes Absolute: 0.4 x10E3/uL (ref 0.1–0.9)
Monocytes: 10 %
Neutrophils Absolute: 1.7 x10E3/uL (ref 1.4–7.0)
Neutrophils: 44 %
Platelets: 223 x10E3/uL (ref 150–450)
RBC: 4.09 x10E6/uL (ref 3.77–5.28)
RDW: 12.9 % (ref 11.7–15.4)
WBC: 3.9 x10E3/uL (ref 3.4–10.8)

## 2024-02-07 LAB — IGE: IgE (Immunoglobulin E), Serum: 12 [IU]/mL (ref 6–495)

## 2024-02-18 ENCOUNTER — Ambulatory Visit: Payer: Self-pay | Admitting: Pulmonary Disease

## 2024-04-02 ENCOUNTER — Other Ambulatory Visit: Payer: Self-pay

## 2024-04-02 ENCOUNTER — Other Ambulatory Visit (HOSPITAL_BASED_OUTPATIENT_CLINIC_OR_DEPARTMENT_OTHER): Payer: Self-pay

## 2024-04-14 ENCOUNTER — Other Ambulatory Visit (HOSPITAL_BASED_OUTPATIENT_CLINIC_OR_DEPARTMENT_OTHER): Payer: Self-pay

## 2024-05-06 ENCOUNTER — Ambulatory Visit (HOSPITAL_BASED_OUTPATIENT_CLINIC_OR_DEPARTMENT_OTHER): Admitting: Pulmonary Disease

## 2024-05-06 ENCOUNTER — Encounter (HOSPITAL_BASED_OUTPATIENT_CLINIC_OR_DEPARTMENT_OTHER): Payer: Self-pay | Admitting: Pulmonary Disease

## 2024-05-18 ENCOUNTER — Ambulatory Visit: Payer: Self-pay

## 2024-05-18 NOTE — Telephone Encounter (Signed)
 FYI Only or Action Required?: FYI only for provider: appointment scheduled on 05/19/2024.  Patient was last seen in primary care on 12/03/2023 by Renne Homans, MD.  Called Nurse Triage reporting Anxiety.  Symptoms began about a month ago.  Interventions attempted: Prescription medications: Hydroxyzine  and celexa .  Symptoms are: unchanged.  Triage Disposition: See PCP When Office is Open (Within 3 Days)  Patient/caregiver understands and will follow disposition?: Yes  Copied from CRM [id number]. Topic: Clinical - Red Word Triage >> May 18, 2024 11:56 AM Chiquita SQUIBB wrote: Red Word that prompted transfer to Nurse Triage: Patient is calling in asking if her anxiety medications can be changed, her partner was recently killed and she has been struggling a lot with mental health, no energy to eat, can't sleep and struggling with reliving the moment. Reason for Disposition  MODERATE anxiety (e.g., persistent or frequent anxiety symptoms; interferes with sleep, school, or work)  Answer Assessment - Initial Assessment Questions 1. CONCERN: Did anything happen that prompted you to call today?      Says celexa  and hydroxyzine  is making her feel angry.  2. ANXIETY SYMPTOMS: Can you describe how you (your loved one; patient) have been feeling? (e.g., tense, restless, panicky, anxious, keyed up, overwhelmed, sense of impending doom).      Restless, sad, lonely 3. ONSET: How long have you been feeling this way? (e.g., hours, days, weeks)     About 1 month ago when he died 4. SEVERITY: How would you rate the level of anxiety? (e.g., 0 - 10; or mild, moderate, severe).     *No Answer* 5. FUNCTIONAL IMPAIRMENT: How have these feelings affected your ability to do daily activities? Have you had more difficulty than usual doing your normal daily activities? (e.g., getting better, same, worse; self-care, school, work, interactions)     Can't eat most days, says she had a lot of regrets inside and  they're eating her inside. Difficulty sleeping, stayed awake till 6 am cleaning thinking if she got out energy, she could sleep but it didn't work. Feels that  6. HISTORY: Have you felt this way before? Have you ever been diagnosed with an anxiety problem in the past? (e.g., generalized anxiety disorder, panic attacks, PTSD). If Yes, ask: How was this problem treated? (e.g., medicines, counseling, etc.)     *No Answer* 7. RISK OF HARM - SUICIDAL IDEATION: Do you ever have thoughts of hurting or killing yourself? If Yes, ask:  Do you have these feelings now? Do you have a plan on how you would do this?     *No Answer* 8. TREATMENT:  What has been done so far to treat this anxiety? (e.g., medicines, relaxation strategies). What has helped?     *No Answer* 9. THERAPIST: Do you have a counselor or therapist? If Yes, ask: What is their name?     Is wanting referral again for counseling. Thinks she was referred to behavioral health before. 10. POTENTIAL TRIGGERS: Do you drink caffeinated beverages (e.g., coffee, colas, teas), and how much daily? Do you drink alcohol or use any drugs? Have you started any new medicines recently?       *No Answer* 11. PATIENT SUPPORT: Who is with you now? Who do you live with? Do you have family or friends who you can talk to?        *No Answer* 12. OTHER SYMPTOMS: Do you have any other symptoms? (e.g., feeling depressed, trouble concentrating, trouble sleeping, trouble breathing, palpitations or fast heartbeat, chest pain,  sweating, nausea, or diarrhea)  Protocols used: Anxiety and Panic Attack-A-AH

## 2024-05-19 ENCOUNTER — Encounter: Payer: Self-pay | Admitting: Student

## 2024-05-19 ENCOUNTER — Ambulatory Visit (INDEPENDENT_AMBULATORY_CARE_PROVIDER_SITE_OTHER): Payer: Self-pay | Admitting: Student

## 2024-05-19 VITALS — BP 114/74 | HR 96 | Temp 98.8°F | Ht 60.0 in | Wt 162.2 lb

## 2024-05-19 DIAGNOSIS — F339 Major depressive disorder, recurrent, unspecified: Secondary | ICD-10-CM

## 2024-05-19 DIAGNOSIS — F418 Other specified anxiety disorders: Secondary | ICD-10-CM

## 2024-05-19 MED ORDER — HYDROXYZINE HCL 25 MG PO TABS: 25.0000 mg | ORAL_TABLET | Freq: Every evening | ORAL | 1 refills | Status: AC | PRN

## 2024-05-19 MED ORDER — DULOXETINE HCL 20 MG PO CPEP: 40.0000 mg | ORAL_CAPSULE | Freq: Every day | ORAL | 3 refills | Status: AC

## 2024-05-19 NOTE — Assessment & Plan Note (Addendum)
 Depressive state in setting of recent traumatic event. Protective factors: family including her children. Endorses 2 firearms at home that are locked. Denies SI. Discussed increased duloxetine  and refilling hydroxyzine . She is agreeable for urgent referral back to psychiatry.   Plan -Provided suicide/crisis resources including [facility] -Increase duloxetine  to 40 mg daily (2x20 mg tablets) -Refilled hydroxyzine  PRN -Urgent referral to re-establish care w/ psych/BH -F/u visit in 2-4 weeks   Orders:   Ambulatory referral to Psychiatry   DULoxetine  (CYMBALTA ) 20 MG capsule; Take 2 capsules (40 mg total) by mouth daily. Take one capsule daily for one week and than twice daily   hydrOXYzine  (ATARAX ) 25 MG tablet; Take 1-2 tablets (25-50 mg total) by mouth at bedtime as needed for anxiety (sleep). Additional refills require follow up with PCP office or psychiatry.

## 2024-05-19 NOTE — Patient Instructions (Addendum)
 Thank you, Ms.Terrilyn GORMAN Kiang for allowing us  to provide your care today. Today we discussed:  -I am sorry to hear about what happened.  -Please reach out to us  if you have any questions or concerns. -We are here to support you.   [facility] Outpatient: [phone] Address [address] [place], [place] [address]  Hours Open 24/7. No appointment required.  If you feel like you may hurt yourself or others, or have thoughts about taking your own life, please get help right away. Here are some resources available to you: -Call your local emergency services (911) -Call the [facility] and [employer] Helpline [phone]) -Go to the nearest emergency room. -Call a suicide hotline to talk to a trained counselor.  1-800-273-TALK 541-013-1935) 1-800-SUICIDE 860 447 4145 (For Spanish speakers) 818-777-4853 (For TTY users) 1-866-4-U-TREVOR 714-680-5490) (For LGBTQ community)  44 (This is a crisis hotline you may call or text)   I have ordered the following medication/changed the following medications:  Start the following medications: Meds ordered this encounter  Medications   DULoxetine  (CYMBALTA ) 20 MG capsule    Sig: Take 2 capsules (40 mg total) by mouth daily. Take one capsule daily for one week and than twice daily    Dispense:  180 capsule    Refill:  3   hydrOXYzine  (ATARAX ) 25 MG tablet    Sig: Take 1-2 tablets (25-50 mg total) by mouth at bedtime as needed for anxiety (sleep). Additional refills require follow up with PCP office or psychiatry.    Dispense:  90 tablet    Refill:  1     Follow up: 2-4 weeks   Should you have any questions or concerns please call the internal medicine clinic at [phone].    Ananiah Maciolek, D.O. [facility]

## 2024-05-19 NOTE — Progress Notes (Signed)
 "  CC: Acute visit: depression  HPI: Sabrina Owens is a 38 y.o. female living with a history stated below and presents today for acute visit. Please see problem based assessment and plan for additional details.  Discussed the use of AI scribe software for clinical note transcription with the patient, who gave verbal consent to proceed.  History of Present Illness   Sabrina Owens is a 38 year old female with a history of depression who presents with worsening depression following a traumatic event.  She has experienced a significant increase in depressive symptoms following the murder of her boyfriend on November 26th. She describes difficulty coping with the loss, stating that she 'can't stop hearing' about the incident and feels a lack of closure. She expresses feelings of guilt and regret, particularly because she had an argument with him prior to his death and had asked him to leave the house.  She reports persistent sadness, difficulty sleeping, and an inability to eat or engage in daily activities. She notes that she has been unable to sleep more than 2.5 hours at a time. Despite these challenges, her children are her motivation to keep going, although she feels 'messed up bad' inside.  No thoughts of self-harm or suicide, stating that these are 'the only thoughts that I don't have.' She acknowledges a history of depression and reports that her depression has come back.  She is currently taking duloxetine  20 mg, which she feels is not effective, and she has run out of hydroxyzine , which she was using for sleep. She previously took citalopram , which was discontinued. She has not seen a mental health professional for over a year.  She has two firearms in the house, which are locked up. She has not sought any other mental health support since the incident.       Past Medical History:  Diagnosis Date   Heart palpitations    Irregular menstrual bleeding    PCOS (polycystic ovarian  syndrome)    Preeclampsia    Renal disorder     Medications Ordered Prior to Encounter[1]  Family History  Problem Relation Age of Onset   High Cholesterol Mother    Sickle cell trait Mother    Diabetes Other     Social History   Socioeconomic History   Marital status: Single    Spouse name: Not on file   Number of children: Not on file   Years of education: Not on file   Highest education level: Not on file  Occupational History   Not on file  Tobacco Use   Smoking status: Former    Current packs/day: 0.00    Types: Cigarettes    Quit date: 2021    Years since quitting: 4.9   Smokeless tobacco: Never  Vaping Use   Vaping status: Never Used  Substance and Sexual Activity   Alcohol use: Yes    Comment: Occasional, mostly weekends   Drug use: Yes    Types: Marijuana    Comment: denies 05/02/16   Sexual activity: Not on file  Other Topics Concern   Not on file  Social History Narrative   Not on file   Social Drivers of Health   Tobacco Use: Medium Risk (02/05/2024)   Patient History    Smoking Tobacco Use: Former    Smokeless Tobacco Use: Never    Passive Exposure: Not on Actuary Strain: Not on file  Food Insecurity: Not on file  Transportation Needs: Not on  file  Physical Activity: Not on file  Stress: Not on file  Social Connections: Not on file  Intimate Partner Violence: Not on file  Depression (PHQ2-9): High Risk (05/19/2024)   Depression (PHQ2-9)    PHQ-2 Score: 24  Alcohol Screen: Not on file  Housing: Not on file  Utilities: Not on file  Health Literacy: Not on file    Review of Systems: ROS negative except for what is noted on the assessment and plan.  Vitals:   05/19/24 1535  BP: 114/74  Pulse: 96  Temp: 98.8 F (37.1 C)  TempSrc: Oral  SpO2: 97%  Weight: 162 lb 3.2 oz (73.6 kg)  Height: 5' (1.524 m)   Physical Exam: Constitutional: alert, sitting up in chair, in no acute distress Cardiovascular: regular rate   Pulmonary/Chest: normal work of breathing on room air Neurological: alert & oriented x 3 Psych: tearful during encounter, depressed, denies SI and no hx of suicide attempt  Assessment & Plan:   Assessment & Plan Episode of recurrent major depressive disorder, unspecified depression episode severity Anxiety with depression Depressive state in setting of recent traumatic event. Protective factors: family including her children. Endorses 2 firearms at home that are locked. Denies SI. Discussed increased duloxetine  and refilling hydroxyzine . She is agreeable for urgent referral back to psychiatry.   Plan -Provided suicide/crisis resources including [facility] -Increase duloxetine  to 40 mg daily (2x20 mg tablets) -Refilled hydroxyzine  PRN -Urgent referral to re-establish care w/ psych/BH -F/u visit in 2-4 weeks   Orders:   Ambulatory referral to Psychiatry   DULoxetine  (CYMBALTA ) 20 MG capsule; Take 2 capsules (40 mg total) by mouth daily. Take one capsule daily for one week and than twice daily   hydrOXYzine  (ATARAX ) 25 MG tablet; Take 1-2 tablets (25-50 mg total) by mouth at bedtime as needed for anxiety (sleep). Additional refills require follow up with PCP office or psychiatry.     Return in about 2 weeks (around 06/02/2024) for mood.   Patient discussed with Dr. Machen  Rachelanne Whidby, D.O. [facility], PGY-3 Clinic Phone: [phone] Date 05/19/2024 Time 7:11 PM      [1]  Current Outpatient Medications on File Prior to Visit  Medication Sig Dispense Refill   albuterol  (PROVENTIL ) (2.5 MG/3ML) 0.083% nebulizer solution Take 3 mLs (2.5 mg total) by nebulization every 6 (six) hours as needed for wheezing or shortness of breath. 75 mL 2   albuterol  (VENTOLIN  HFA) 108 (90 Base) MCG/ACT inhaler Inhale 2 puffs into the lungs every 2 (two) hours as needed for wheezing or shortness of breath (cough). 8.5 g 3   budesonide -formoterol  (SYMBICORT ) 160-4.5  MCG/ACT inhaler Inhale 2 puffs into the lungs 2 (two) times daily. 10.2 g 5   propranolol  (INDERAL ) 10 MG tablet TAKE 1 TABLET BY MOUTH EVERY 30-60 MIN BEFORE ANXIETY PROVOKING SITUATION 90 tablet 0   [DISCONTINUED] cetirizine  (ZYRTEC  ALLERGY) 10 MG tablet Take 1 tablet (10 mg total) by mouth daily. 30 tablet 0   [DISCONTINUED] fluticasone  (FLONASE ) 50 MCG/ACT nasal spray Place 1 spray into both nostrils daily. 16 g 0   No current facility-administered medications on file prior to visit.   "

## 2024-05-20 ENCOUNTER — Other Ambulatory Visit: Payer: Self-pay

## 2024-05-20 ENCOUNTER — Other Ambulatory Visit (HOSPITAL_BASED_OUTPATIENT_CLINIC_OR_DEPARTMENT_OTHER): Payer: Self-pay

## 2024-05-20 NOTE — Progress Notes (Signed)
 Internal Medicine Clinic Attending  Case discussed with the resident at the time of the visit.  We reviewed the resident's history and exam and pertinent patient test results.  I agree with the assessment, diagnosis, and plan of care documented in the resident's note.

## 2024-05-25 NOTE — Telephone Encounter (Signed)
 Pt saw Dr Elicia on 12/30.

## 2024-05-27 ENCOUNTER — Encounter (HOSPITAL_BASED_OUTPATIENT_CLINIC_OR_DEPARTMENT_OTHER): Payer: Self-pay | Admitting: Pulmonary Disease

## 2024-05-27 ENCOUNTER — Ambulatory Visit (INDEPENDENT_AMBULATORY_CARE_PROVIDER_SITE_OTHER): Payer: Self-pay | Admitting: Pulmonary Disease

## 2024-05-27 VITALS — BP 108/70 | HR 80 | Temp 98.5°F | Ht 61.0 in | Wt 163.0 lb

## 2024-05-27 DIAGNOSIS — F1290 Cannabis use, unspecified, uncomplicated: Secondary | ICD-10-CM | POA: Diagnosis not present

## 2024-05-27 DIAGNOSIS — J4540 Moderate persistent asthma, uncomplicated: Secondary | ICD-10-CM

## 2024-05-27 NOTE — Patient Instructions (Signed)
 Moderate persistent asthma --CONTINUE Symbicort  160-4.5 mcg TWO puffs in the morning and evening. Rinse mouth after use --CONTINUE Albuterol  1-2 puffs AS NEEDED for shortness of breath or wheezing --Reduce smoking by half. Every other hour --Nebulizer as needed for shortness of breath or wheezing

## 2024-05-27 NOTE — Progress Notes (Signed)
 "   Subjective:   PATIENT ID: Sabrina Owens GENDER: female DOB: 1985/08/01, MRN: [id number]  Chief Complaint  Patient presents with   Asthma         Reason for Visit: Follow-up shortness of breath  Sabrina Owens is a 39 year old female former smoker with PCOS, palpitations who presents for evaluation for abnormal PFTs.  Initial consult She reports shortness of breath, chest tightness and wheezing at all times. Longstanding symptoms but worsening in the last 3-4 months. Worsens with walking short distances and walking upstairs. Screaming and yelling will cause her to be dizzy and will cause her to lose her voice. Never had childhood asthma but reports frequent bronchitis flares since your teens. She has primarily been on albuterol  and was started Arnuity since 11/2023. She uses albuterol  2-4 times a day and does acknowledge arnuity helps. Has tried nebulizers in the past with improvement in symptoms. Daughter and aunt (passed middle aged) has asthma  05/27/24 Since our last visit she has started Symbicort  and feels it helped with chest tightness and shortness of breath. Now only having symptoms 1-2 times a week with inhaler use. No nocturnal symptoms. Walking up and down steps does still cause shortness of breath. Not needed nebulizers  Social History: Quit smoking. Social smoker. Weed daily with smoker Works from home Dogs- short haired. Indoor and outdoor  Environmental exposures: None  Asthma Control Test ACT Total Score  05/27/2024  1:45 PM 21    Past Medical History:  Diagnosis Date   Heart palpitations    Irregular menstrual bleeding    PCOS (polycystic ovarian syndrome)    Preeclampsia    Renal disorder      Family History  Problem Relation Age of Onset   High Cholesterol Mother    Sickle cell trait Mother    Diabetes Other      Social History   Occupational History   Not on file  Tobacco Use   Smoking status: Former    Current packs/day: 0.00    Types:  Cigarettes    Quit date: 2021    Years since quitting: 5.0   Smokeless tobacco: Never  Vaping Use   Vaping status: Never Used  Substance and Sexual Activity   Alcohol use: Yes    Comment: Occasional, mostly weekends   Drug use: Yes    Types: Marijuana    Comment: denies 05/02/16   Sexual activity: Not on file    Allergies  Allergen Reactions   Ancef [Cefazolin] Hives     Outpatient Medications Prior to Visit  Medication Sig Dispense Refill   albuterol  (PROVENTIL ) (2.5 MG/3ML) 0.083% nebulizer solution Take 3 mLs (2.5 mg total) by nebulization every 6 (six) hours as needed for wheezing or shortness of breath. 75 mL 2   albuterol  (VENTOLIN  HFA) 108 (90 Base) MCG/ACT inhaler Inhale 2 puffs into the lungs every 2 (two) hours as needed for wheezing or shortness of breath (cough). 8.5 g 3   budesonide -formoterol  (SYMBICORT ) 160-4.5 MCG/ACT inhaler Inhale 2 puffs into the lungs 2 (two) times daily. 10.2 g 5   DULoxetine  (CYMBALTA ) 20 MG capsule Take 2 capsules (40 mg total) by mouth daily. Take one capsule daily for one week and than twice daily 180 capsule 3   hydrOXYzine  (ATARAX ) 25 MG tablet Take 1-2 tablets (25-50 mg total) by mouth at bedtime as needed for anxiety (sleep). Additional refills require follow up with PCP office or psychiatry. 90 tablet 1   propranolol  (INDERAL )  10 MG tablet TAKE 1 TABLET BY MOUTH EVERY 30-60 MIN BEFORE ANXIETY PROVOKING SITUATION 90 tablet 0   No facility-administered medications prior to visit.    Review of Systems  Constitutional:  Negative for chills, diaphoresis, fever, malaise/fatigue and weight loss.  HENT:  Negative for congestion.   Respiratory:  Positive for cough and shortness of breath. Negative for hemoptysis, sputum production and wheezing.   Cardiovascular:  Negative for chest pain, palpitations and leg swelling.     Objective:   Vitals:   05/27/24 1342  BP: 108/70  Pulse: 80  Temp: 98.5 F (36.9 C)  SpO2: 100%  Weight: 163  lb (73.9 kg)  Height: 5' 1 (1.549 m)   SpO2: 100 %  Physical Exam: General: Well-appearing, no acute distress HENT: [place], AT Eyes: EOMI, no scleral icterus Respiratory: Clear to auscultation bilaterally.  No crackles, wheezing or rales Cardiovascular: RRR, -M/R/G, no JVD Extremities:-Edema,-tenderness Neuro: AAO x4, CNII-XII grossly intact Psych: Normal mood, normal affect  Data Reviewed:  Imaging: CXR 05/02/16 - No acute infiltrate effusion or edema  PFT: 12/10/23 FVC 2.46 (75%) FEV1 2.18 (80%) Ratio 88. Significant bronchodilator response with change in FVC +27% and FEV1 +12%. Interpretation: No spirometry. +BD response suggestive asthma  Labs: CBC    Component Value Date/Time   WBC 3.9 02/05/2024 1027   WBC 4.7 08/06/2022 2032   RBC 4.09 02/05/2024 1027   RBC 4.19 08/06/2022 2032   HGB 11.4 02/05/2024 1027   HGB 12.4 10/11/2014 1026   HCT 37.5 02/05/2024 1027   HCT 38.7 10/11/2014 1026   PLT 223 02/05/2024 1027   MCV 92 02/05/2024 1027   MCV 88.7 10/11/2014 1026   MCH 27.9 02/05/2024 1027   MCH 28.9 08/06/2022 2032   MCHC 30.4 (L) 02/05/2024 1027   MCHC 32.3 08/06/2022 2032   RDW 12.9 02/05/2024 1027   RDW 14.2 10/11/2014 1026   LYMPHSABS 1.7 02/05/2024 1027   LYMPHSABS 1.7 10/11/2014 1026   MONOABS 0.3 07/24/2020 1052   MONOABS 0.3 10/11/2014 1026   EOSABS 0.1 02/05/2024 1027   BASOSABS 0.0 02/05/2024 1027   BASOSABS 0.0 10/11/2014 1026   No recent eos available     Assessment & Plan:   Discussion: 39 year old female former smoker with PCOS, palpitations who presents for follow-up for asthma. Improving significantly with Symbicort . Counseled on cannabis smoking cessation. Discussed clinical course and management of asthma including bronchodilator regimen, preventive care and action plan for exacerbation.    Moderate persistent asthma --CONTINUE Symbicort  160-4.5 mcg TWO puffs in the morning and evening. Rinse mouth after use --CONTINUE Albuterol  1-2  puffs AS NEEDED for shortness of breath or wheezing --Recommend smoking by half. Every other hour --Nebulizer as needed for shortness of breath or wheezing  Health Maintenance Immunization History  Administered Date(s) Administered   Tdap 01/08/2022   CT Lung Screen - not qualified  No orders of the defined types were placed in this encounter.  No orders of the defined types were placed in this encounter.   Return in about 6 months (around 11/24/2024).  I have spent a total time of 30-minutes on the day of the appointment including chart review, data review, collecting history, coordinating care and discussing medical diagnosis and plan with the patient/family. Past medical history, allergies, medications were reviewed. Pertinent imaging, labs and tests included in this note have been reviewed and interpreted independently by me.  Matsuko Kretz Slater Staff, MD [facility] 05/27/2024 2:11 PM     "

## 2024-11-17 ENCOUNTER — Ambulatory Visit (HOSPITAL_BASED_OUTPATIENT_CLINIC_OR_DEPARTMENT_OTHER): Admitting: Pulmonary Disease
